# Patient Record
Sex: Female | Born: 1950
Health system: Southern US, Community
[De-identification: ages and names within clinical notes are randomized; demographics above are authoritative.]

## PROBLEM LIST (undated history)

## (undated) DIAGNOSIS — D573 Sickle-cell trait: Secondary | ICD-10-CM

## (undated) DIAGNOSIS — H47011 Ischemic optic neuropathy, right eye: Secondary | ICD-10-CM

## (undated) DIAGNOSIS — I4891 Unspecified atrial fibrillation: Secondary | ICD-10-CM

## (undated) DIAGNOSIS — I480 Paroxysmal atrial fibrillation: Secondary | ICD-10-CM

## (undated) DIAGNOSIS — H547 Unspecified visual loss: Secondary | ICD-10-CM

## (undated) DIAGNOSIS — B1920 Unspecified viral hepatitis C without hepatic coma: Secondary | ICD-10-CM

## (undated) DIAGNOSIS — T7840XA Allergy, unspecified, initial encounter: Secondary | ICD-10-CM

## (undated) DIAGNOSIS — M19049 Primary osteoarthritis, unspecified hand: Secondary | ICD-10-CM

## (undated) DIAGNOSIS — J302 Other seasonal allergic rhinitis: Secondary | ICD-10-CM

## (undated) HISTORY — DX: Paroxysmal atrial fibrillation: I48.0

## (undated) HISTORY — DX: Primary osteoarthritis, unspecified hand: M19.049

## (undated) HISTORY — DX: Unspecified viral hepatitis C without hepatic coma: B19.20

## (undated) HISTORY — DX: Ischemic optic neuropathy, right eye: H47.011

## (undated) HISTORY — DX: Other seasonal allergic rhinitis: J30.2

## (undated) HISTORY — DX: Allergy, unspecified, initial encounter: T78.40XA

## (undated) HISTORY — DX: Sickle-cell trait: D57.3

## (undated) HISTORY — DX: Unspecified atrial fibrillation: I48.91

## (undated) HISTORY — PX: TUMOR EXCISION: SHX421

## (undated) HISTORY — DX: Unspecified visual loss: H54.7

---

## 2003-10-07 HISTORY — PX: SALPINGECTOMY: SHX328

## 2009-07-23 ENCOUNTER — Encounter: Admission: RE | Admit: 2009-07-23 | Discharge: 2009-07-23 | Payer: Self-pay | Admitting: Infectious Diseases

## 2012-03-17 ENCOUNTER — Ambulatory Visit (INDEPENDENT_AMBULATORY_CARE_PROVIDER_SITE_OTHER): Payer: Self-pay | Admitting: Family Medicine

## 2012-03-17 ENCOUNTER — Encounter: Payer: Self-pay | Admitting: Family Medicine

## 2012-03-17 VITALS — BP 144/82 | HR 60 | Temp 97.8°F | Ht 62.0 in | Wt 155.8 lb

## 2012-03-17 DIAGNOSIS — Z0000 Encounter for general adult medical examination without abnormal findings: Secondary | ICD-10-CM

## 2012-03-17 DIAGNOSIS — J302 Other seasonal allergic rhinitis: Secondary | ICD-10-CM

## 2012-03-17 DIAGNOSIS — H47019 Ischemic optic neuropathy, unspecified eye: Secondary | ICD-10-CM

## 2012-03-17 DIAGNOSIS — Z01419 Encounter for gynecological examination (general) (routine) without abnormal findings: Secondary | ICD-10-CM | POA: Insufficient documentation

## 2012-03-17 DIAGNOSIS — H47011 Ischemic optic neuropathy, right eye: Secondary | ICD-10-CM | POA: Insufficient documentation

## 2012-03-17 DIAGNOSIS — J309 Allergic rhinitis, unspecified: Secondary | ICD-10-CM

## 2012-03-17 HISTORY — DX: Other seasonal allergic rhinitis: J30.2

## 2012-03-17 HISTORY — DX: Ischemic optic neuropathy, right eye: H47.011

## 2012-03-17 NOTE — Patient Instructions (Addendum)
Please go to [facility] to get qualified for financial assistance Once you have that, please call for an appt to follow up with [place] You will need to have your cholesterol checked and get a Pap smear  Please ask the front desk to change your doctor to Dr. Rodman Pickle

## 2012-03-17 NOTE — Assessment & Plan Note (Signed)
Exam today but deferred testing until qualified for orange card

## 2012-03-17 NOTE — Progress Notes (Signed)
  Subjective:    Patient ID: Melanie Riddle, female    DOB: [birth year], 61 y.o.   MRN: [id number]  HPI Patient here to establish care. Last Pap 2010 was normal DEXA scan 2010 was normal Mammogram 2010 was normal Does not remember her last tetanus shot  Patient with dry eyes and went to see optometrist recently. She was diagnosed with right optic nerve ischemia changes. She was told that she would need followup with a primary care doctor to evaluate her risk factors. She continues to have some pain on the right side that her optometrist told her was consistent with these changes. She denies any chest pain, shortness of breath. She had a visual field test which did show some changes.   Review of Systems No HA, CP, SOB, N/V/D    Objective:   Physical Exam Vital signs reviewed General appearance - alert, well appearing, and in no distress Heart - normal rate, regular rhythm, normal S1, S2, no murmurs, rubs, clicks or gallops Chest - clear to auscultation, no wheezes, rales or rhonchi, symmetric air entry, no tachypnea, retractions or cyanosis Abdomen - soft, nontender, nondistended, no masses or organomegaly Extremities - peripheral pulses normal, no pedal edema, no clubbing or cyanosis        Assessment & Plan:

## 2012-04-07 ENCOUNTER — Ambulatory Visit (INDEPENDENT_AMBULATORY_CARE_PROVIDER_SITE_OTHER): Payer: Self-pay | Admitting: Family Medicine

## 2012-04-07 ENCOUNTER — Encounter: Payer: Self-pay | Admitting: Family Medicine

## 2012-04-07 ENCOUNTER — Other Ambulatory Visit (HOSPITAL_COMMUNITY)
Admission: RE | Admit: 2012-04-07 | Discharge: 2012-04-07 | Disposition: A | Discharge status: Home / Self Care | Payer: Self-pay | Source: Ambulatory Visit | Attending: Family Medicine | Admitting: Family Medicine

## 2012-04-07 VITALS — BP 141/79 | HR 54 | Ht 62.0 in | Wt 155.0 lb

## 2012-04-07 DIAGNOSIS — H47011 Ischemic optic neuropathy, right eye: Secondary | ICD-10-CM

## 2012-04-07 DIAGNOSIS — H47019 Ischemic optic neuropathy, unspecified eye: Secondary | ICD-10-CM

## 2012-04-07 DIAGNOSIS — Z0000 Encounter for general adult medical examination without abnormal findings: Secondary | ICD-10-CM

## 2012-04-07 DIAGNOSIS — M19049 Primary osteoarthritis, unspecified hand: Secondary | ICD-10-CM

## 2012-04-07 DIAGNOSIS — Z1159 Encounter for screening for other viral diseases: Secondary | ICD-10-CM | POA: Insufficient documentation

## 2012-04-07 DIAGNOSIS — Z113 Encounter for screening for infections with a predominantly sexual mode of transmission: Secondary | ICD-10-CM | POA: Insufficient documentation

## 2012-04-07 DIAGNOSIS — Z01419 Encounter for gynecological examination (general) (routine) without abnormal findings: Secondary | ICD-10-CM | POA: Insufficient documentation

## 2012-04-07 HISTORY — DX: Primary osteoarthritis, unspecified hand: M19.049

## 2012-04-07 LAB — CBC
HCT: 38.1 % (ref 36.0–46.0)
Platelets: 231 10*3/uL (ref 150–400)
RBC: 4.34 MIL/uL (ref 3.87–5.11)
WBC: 6 10*3/uL (ref 4.0–10.5)

## 2012-04-07 NOTE — Assessment & Plan Note (Signed)
Patient stable. No changes in vision or range of motion of eye. Will follow up with optometry within one year. Will continue to follow with them. If she has any changes, she will return to office.

## 2012-04-07 NOTE — Assessment & Plan Note (Signed)
Patient overall very healthy. PAP collected today. Direct LDL and CBC collected. Will send results to patient. Return to office in 6 months, or sooner if she needs anything.

## 2012-04-07 NOTE — Progress Notes (Signed)
Subjective:     Patient ID: Melanie Riddle, female   DOB: March 31, 1951, 61 y.o.   MRN: [id number]  HPI Pt is a 61 yo F presenting for CPE. Patients concerns today are about her known ischemic optic neuropathy as well as intermittent right hand/wrist pain.  1. CPE- Last PAP >3 years ago. She has not had a mammogram recently either due to loss of insurance. She also would like her LDL checked today (ate this morning.) Patient is a vegetarian and does have to take iron occassionally but she is not sure if she has anemia. Overall, patient states she feels well. No headaches, no SOB, No CP, no abd pain, no vaginal bleeding, no vaginal d/c, no leg pain.   2. Ischemic optic neuropathy of right eye- Found on annual eye exam. Followed by Dr. Jorene Minors at [facility] center. She has not had any loss of vision. She does report some right jaw pain and difficulty chewing but no changes in vision.  3. Right wrist pain- She has noticed intermittent wrist pain. She does not like to take OTC medications. She has used natural rubs and ointments including menthol and Eucalyptus which helps some. She does not have any numbness. No difficulty with her daily activities. No nighttime awakenings with pain. She states it feels like arthritis.   History reviewed: Never smoker   Review of Systems Please see HPI above    Objective:   Physical Exam  Constitutional: She appears well-developed and well-nourished. No distress.  HENT:  Head: Normocephalic and atraumatic.  Right Ear: External ear normal.  Left Ear: External ear normal.  Mouth/Throat: Oropharynx is clear and moist.  Eyes: Conjunctivae, EOM and lids are normal. Pupils are equal, round, and reactive to light.  Fundoscopic exam:      The right eye shows no papilledema. The right eye shows red reflex.      The left eye shows no papilledema. The left eye shows red reflex. Neck: Normal range of motion.  Cardiovascular: Normal rate, regular rhythm and normal heart  sounds.   No murmur heard. Pulmonary/Chest: Effort normal and breath sounds normal.  Abdominal: Soft. There is no tenderness.  Genitourinary: Vagina normal and uterus normal. Cervix exhibits no motion tenderness and no discharge. Right adnexum displays no tenderness. Left adnexum displays no tenderness. No vaginal discharge found.  Musculoskeletal: Normal range of motion. She exhibits no edema.  Lymphadenopathy:    She has no cervical adenopathy.  Skin: Skin is warm and dry.   Assessment:     61 yo complete physical exam    Plan:

## 2012-04-07 NOTE — Assessment & Plan Note (Signed)
Pain in hand likely secondary to arthritis. No swelling, redness or effusions. Continue to use herbal rubs. Can also use heat, but should be avoided if using menthol. Patient does not like tylenol but can take if she would like. Will re-evaluate at next visit.

## 2012-04-07 NOTE — Patient Instructions (Signed)
It was so nice to meet you today!  I am glad everything is going well for you. I would try using heat on your wrist. You can also continue to use your rub, but not at the same time as the heat.  I will send you a letter with your labs. Please continue to be seen by the eye doctor once per year. Come back to see me in 6 months for a follow up.   Thank you. Call if you need anything!  Cambree Hendrix M. Bernis Stecher, M.D.

## 2012-04-08 LAB — LDL CHOLESTEROL, DIRECT: Direct LDL: 97 mg/dL

## 2012-04-09 ENCOUNTER — Encounter: Payer: Self-pay | Admitting: Family Medicine

## 2012-04-14 ENCOUNTER — Encounter: Payer: Self-pay | Admitting: Family Medicine

## 2012-04-23 ENCOUNTER — Ambulatory Visit
Admission: RE | Admit: 2012-04-23 | Discharge: 2012-04-23 | Disposition: A | Discharge status: Home / Self Care | Payer: Self-pay | Source: Ambulatory Visit | Attending: Family Medicine | Admitting: Family Medicine

## 2012-04-23 DIAGNOSIS — Z0000 Encounter for general adult medical examination without abnormal findings: Secondary | ICD-10-CM

## 2012-09-13 ENCOUNTER — Encounter: Payer: Self-pay | Admitting: Family Medicine

## 2012-09-13 ENCOUNTER — Ambulatory Visit (INDEPENDENT_AMBULATORY_CARE_PROVIDER_SITE_OTHER): Payer: No Typology Code available for payment source | Admitting: Family Medicine

## 2012-09-13 VITALS — BP 124/73 | HR 69 | Temp 98.5°F | Ht 62.0 in | Wt 152.0 lb

## 2012-09-13 DIAGNOSIS — K0889 Other specified disorders of teeth and supporting structures: Secondary | ICD-10-CM

## 2012-09-13 DIAGNOSIS — K089 Disorder of teeth and supporting structures, unspecified: Secondary | ICD-10-CM

## 2012-09-13 DIAGNOSIS — Z23 Encounter for immunization: Secondary | ICD-10-CM

## 2012-09-13 NOTE — Patient Instructions (Signed)
It was good to see you today!  I have placed referral to dentistry. We will call you with this appointment, but it may take many months to get in.  Please let me know if you need anything! Take care! Jesstin Studstill M. Dianely Krehbiel, M.D.

## 2012-09-13 NOTE — Assessment & Plan Note (Signed)
Patient with dental pain secondary to bridge work on right lower teeth. Also with caries of teeth. Requesting referral to dentistry. Patient does not have a dentist established in this area. Will refer for evaluation, and patient is aware there is a waiting list. At this time, no acute indication for antibiotics or pain medication. Will follow up as needed.

## 2012-09-13 NOTE — Progress Notes (Signed)
Patient ID: Raoul Pitch, female   DOB: 08/28/1951, 61 y.o.   MRN: [id number] [facility] Keirstan Iannello M. Osceola Depaz, MD Phone: [phone]   Subjective: HPI: Patient is a 61 y.o. female presenting to clinic today for dental pain. Concerns today include needs dental referral. Patient has history of bridge work in 2002 in [place], [place]. Last saw dentist 4 years ago; and when she lost her benefits she has not been able to see dentist. Now having pain around the bridge on right bottom gum. No fevers, no pain in jaw. Does notice bleeding with flossing. She would like referral to dentistry before the problem progresses.   History Reviewed: Former smoker. Health Maintenance: Needs flu shot today. Mammo in July 2013  ROS: Please see HPI above.  Objective: Office vital signs reviewed.  Physical Examination:  General: Awake, alert. NAD HEENT: Atraumatic, normocephalic. Posterior pharynx clear with no erythema. Bridge on right lower teeth without surrounding erythema and mild edema. Caries noted in multiple teeth. Good jaw movement without pain.  Neck: No masses palpated. Shotty LAD Pulm: CTAB, no wheezes Cardio: RRR, no murmurs appreciated Neuro: Grossly intact. CN 2-12 intact  Assessment: 61 yo F with dental pain  Plan: See Problem List and After Visit Summary

## 2013-02-08 ENCOUNTER — Ambulatory Visit (INDEPENDENT_AMBULATORY_CARE_PROVIDER_SITE_OTHER): Payer: No Typology Code available for payment source | Admitting: Family Medicine

## 2013-02-08 VITALS — BP 144/76 | HR 58 | Temp 97.6°F | Ht 62.0 in | Wt 152.0 lb

## 2013-02-08 DIAGNOSIS — R5383 Other fatigue: Secondary | ICD-10-CM

## 2013-02-08 DIAGNOSIS — R5381 Other malaise: Secondary | ICD-10-CM

## 2013-02-08 DIAGNOSIS — M2550 Pain in unspecified joint: Secondary | ICD-10-CM

## 2013-02-08 LAB — COMPREHENSIVE METABOLIC PANEL
ALT: 28 U/L (ref 0–35)
Calcium: 9.6 mg/dL (ref 8.4–10.5)
Chloride: 105 mEq/L (ref 96–112)
Glucose, Bld: 68 mg/dL — ABNORMAL LOW (ref 70–99)
Potassium: 4.3 mEq/L (ref 3.5–5.3)
Total Bilirubin: 1 mg/dL (ref 0.3–1.2)

## 2013-02-08 LAB — POCT SEDIMENTATION RATE: POCT SED RATE: 10 mm/hr (ref 0–22)

## 2013-02-08 LAB — CBC
Platelets: 214 10*3/uL (ref 150–400)
RBC: 4.45 MIL/uL (ref 3.87–5.11)

## 2013-02-08 LAB — TSH: TSH: 0.675 u[IU]/mL (ref 0.350–4.500)

## 2013-02-08 NOTE — Patient Instructions (Signed)
I am going to check a few labs today to see if we can find a reason for your symptoms. It is possible that these labs will be normal. I will call you with the results either way.  For now, continue what you are doing. I would also recommend taking Aleve twice daily to help with inflammation.  I will see you back in a few weeks for follow up.  Melbourne Jakubiak M. Celeste Tavenner, M.D.

## 2013-02-08 NOTE — Assessment & Plan Note (Signed)
DDx includes rheumatoid arthritis, post-viral reactive arthritis, DJD (less likely given the subacute time frame), or PMR. Will check sed rate today for inflammatory marker. Will also check CBC and CMet. If liver function elevated consider hepatitis as cause of reactive arthritis. F/u in 2-3 weeks.

## 2013-02-08 NOTE — Assessment & Plan Note (Signed)
See joint pain assessment. Unsure of exact etiology. It could be primary cause of fatigue (such as thyroid dysfunction) that is causing her to notice chronic joint pain, or it could also be post-viral illness. Will check CBC for anemia and TSH for thyroid. Will call pt with results.

## 2013-02-08 NOTE — Addendum Note (Signed)
Addended by: [place], Twylla Arceneaux on: 02/08/2013 12:25 PM   Modules accepted: Orders

## 2013-02-08 NOTE — Progress Notes (Signed)
Patient ID: Melanie Riddle, female   DOB: 11/28/1950, 62 y.o.   MRN: [id number]  [facility] Miamor Ayler M. Sharalyn Lomba, MD Phone: [phone]   Subjective: HPI: Patient is a 62 y.o. female presenting to clinic today for same day appointment.  Fatigue- Increased fatigue in the last 3 weeks. Fatigue is about the same but she is starting to notice it more. Able to function at same level but increased desire to rest. Had a bad cold first week in April (took week off), but states it did not feel like the flu. No known rashes. No bug bites or tick bites. Never had an experience like this before. No naps during the day but has noticed sleep pattern changes and can't sleep through the night.   Joint pain- New achiness in the joints, especially in ankles and feet. She states the bottom of feet are very tender. Some relief with massage but comes right back. Corn on right toe aches, especially at night. Wrists also hurt and middle/lower back pain. No medications, prefers natural treatment. Denies weakness in legs, no numbness or tingling.   Reports SOB with walking. Denies HA, changes in vision, palpitations, N/V/D, dysuria, loss of bowel/bladder, numbness in legs.  History Reviewed: Non-smoker. Health Maintenance: UTD except colonoscopy.   ROS: Please see HPI above.  Objective: Office vital signs reviewed. BP 144/76  Pulse 58  Temp(Src) 97.6 F (36.4 C) (Oral)  Ht 5\' 2"  (1.575 m)  Wt 152 lb (68.947 kg)  BMI 27.79 kg/m2  Physical Examination:  General: Awake, alert. NAD. Pleasant HEENT: Atraumatic, normocephalic. MMM. Pupils equal and reactive Neck: No masses palpated. No LAD Pulm: CTAB, no wheezes. Good effort Cardio: Bradycardic, regular rhythm, no murmurs appreciated Abdomen:+BS, soft, nontender, nondistended Extremities: No edema. 4/5 strength upper extremities, and equal bilaterally. Mild weakness of right hip flexor compared to left but able to stand without assistance.  Otherwise, strength normal Neuro: Grossly intact without focal findings  Assessment: 62 y.o. female with fatigue and joint pain  Plan: See Problem List and After Visit Summary

## 2013-02-09 ENCOUNTER — Encounter: Payer: Self-pay | Admitting: Family Medicine

## 2013-02-09 ENCOUNTER — Telehealth: Payer: Self-pay | Admitting: Family Medicine

## 2013-02-09 NOTE — Telephone Encounter (Signed)
Spoke with patient and reassured her that all of her labs looked normal. Will send letter with her lab results for her reference but no obvious cause of her symptoms noted on labs.  Harris Kistler M. Shenae Bonanno, M.D. 02/09/2013 4:01 PM

## 2013-03-31 ENCOUNTER — Ambulatory Visit: Payer: Self-pay | Admitting: Family Medicine

## 2013-07-13 ENCOUNTER — Ambulatory Visit (INDEPENDENT_AMBULATORY_CARE_PROVIDER_SITE_OTHER): Payer: No Typology Code available for payment source | Admitting: *Deleted

## 2013-07-13 DIAGNOSIS — Z23 Encounter for immunization: Secondary | ICD-10-CM

## 2014-03-08 ENCOUNTER — Encounter: Payer: Self-pay | Admitting: Family Medicine

## 2014-03-08 ENCOUNTER — Ambulatory Visit (INDEPENDENT_AMBULATORY_CARE_PROVIDER_SITE_OTHER): Payer: Medicare Other | Admitting: Family Medicine

## 2014-03-08 VITALS — BP 110/60 | HR 75 | Temp 98.2°F | Wt 157.0 lb

## 2014-03-08 DIAGNOSIS — Z789 Other specified health status: Secondary | ICD-10-CM

## 2014-03-08 DIAGNOSIS — Z0000 Encounter for general adult medical examination without abnormal findings: Secondary | ICD-10-CM | POA: Diagnosis not present

## 2014-03-08 DIAGNOSIS — R5381 Other malaise: Secondary | ICD-10-CM | POA: Diagnosis not present

## 2014-03-08 DIAGNOSIS — R5383 Other fatigue: Secondary | ICD-10-CM | POA: Diagnosis not present

## 2014-03-08 DIAGNOSIS — Z136 Encounter for screening for cardiovascular disorders: Secondary | ICD-10-CM | POA: Diagnosis not present

## 2014-03-08 DIAGNOSIS — Z01419 Encounter for gynecological examination (general) (routine) without abnormal findings: Secondary | ICD-10-CM

## 2014-03-08 LAB — BASIC METABOLIC PANEL
BUN: 11 mg/dL (ref 6–23)
CHLORIDE: 106 meq/L (ref 96–112)
CO2: 28 meq/L (ref 19–32)
Calcium: 9.4 mg/dL (ref 8.4–10.5)
Creat: 0.72 mg/dL (ref 0.50–1.10)
Glucose, Bld: 71 mg/dL (ref 70–99)
Potassium: 4.2 mEq/L (ref 3.5–5.3)
SODIUM: 141 meq/L (ref 135–145)

## 2014-03-08 LAB — TSH: TSH: 0.78 u[IU]/mL (ref 0.350–4.500)

## 2014-03-08 LAB — CBC
HCT: 39.4 % (ref 36.0–46.0)
HEMOGLOBIN: 13.5 g/dL (ref 12.0–15.0)
MCH: 29.7 pg (ref 26.0–34.0)
MCHC: 34.3 g/dL (ref 30.0–36.0)
MCV: 86.6 fL (ref 78.0–100.0)
Platelets: 241 10*3/uL (ref 150–400)
RBC: 4.55 MIL/uL (ref 3.87–5.11)
RDW: 13.6 % (ref 11.5–15.5)
WBC: 6.3 10*3/uL (ref 4.0–10.5)

## 2014-03-08 MED ORDER — ZOSTER VACCINE LIVE 19400 UNT/0.65ML ~~LOC~~ SOLR: 0.6500 mL | Freq: Once | SUBCUTANEOUS | Status: DC

## 2014-03-08 MED ORDER — TETANUS-DIPHTH-ACELL PERTUSSIS 5-2.5-18.5 LF-MCG/0.5 IM SUSP: 0.5000 mL | Freq: Once | INTRAMUSCULAR | Status: DC

## 2014-03-08 NOTE — Assessment & Plan Note (Signed)
Improving with multivitamin and bee pollen  - TSH, CBC for anemia and Bmet for electrolyte abnormalities - F/u prn

## 2014-03-08 NOTE — Progress Notes (Signed)
Patient ID: Melanie Riddle, female   DOB: November 25, 1950, 63 y.o.   MRN: [id number]    Subjective: HPI: Patient is a 63 y.o. female presenting to clinic today for CPE. Concerns today include: Fatigue  1. Health maintenance:  - UTD on pap - Needs mammo - Never had colonoscopy, hesitant to get it done - Unsure last Tdap - Never had zostavax - UTD on flu vaccine  2. Fatigue: Continues to have some fatigue. She is using Multivitamin and bee pollen which helps her some. Last TSH normal. Able to do all activities. She is a vegetarian.    History Reviewed: Former smoker.   ROS: Please see HPI above.  Objective: Office vital signs reviewed. BP 110/60  Pulse 75  Temp(Src) 98.2 F (36.8 C) (Oral)  Wt 157 lb (71.215 kg)  Physical Examination:  General: Awake, alert. NAD HEENT: Atraumatic, normocephalic Neck: No masses palpated. No LAD Pulm: CTAB, no wheezes Cardio: RRR, no murmurs appreciated Abdomen:+BS, soft, nontender, nondistended Extremities: No edema Neuro: Grossly intact  Assessment: 63 y.o. female CPE  Plan: See Problem List and After Visit Summary

## 2014-03-08 NOTE — Assessment & Plan Note (Signed)
Health maintenance updated-  Given Rx for Tdap and Zostavax Will get Prevnar this fall at time of flu shot Will get mammo Unable to order Lipids due to insurance Does not want colonoscopy but I will look into other screening methods

## 2014-03-08 NOTE — Patient Instructions (Signed)
It was good to see you today.  - Please schedule your mammogram - I will do research for the CT scan for colon cancer screening and let you know - I have printed the shingles vaccine if you chose to get that - When you get your flu shot, please ask for the Prevnar 13 pneumonia vaccine as well.  I will call you if any of your labs are abnormal. Otherwise, I will send you a letter.  Blaklee Shores M. Tomie Elko, M.D.

## 2014-03-09 ENCOUNTER — Encounter: Payer: Self-pay | Admitting: Family Medicine

## 2014-06-28 DIAGNOSIS — I480 Paroxysmal atrial fibrillation: Secondary | ICD-10-CM

## 2014-06-28 HISTORY — DX: Paroxysmal atrial fibrillation: I48.0

## 2014-07-12 ENCOUNTER — Ambulatory Visit (INDEPENDENT_AMBULATORY_CARE_PROVIDER_SITE_OTHER): Payer: Medicare Other | Admitting: *Deleted

## 2014-07-12 DIAGNOSIS — Z23 Encounter for immunization: Secondary | ICD-10-CM | POA: Diagnosis not present

## 2014-08-01 ENCOUNTER — Telehealth: Payer: Self-pay | Admitting: Family Medicine

## 2014-08-01 ENCOUNTER — Other Ambulatory Visit: Payer: Self-pay

## 2014-08-01 DIAGNOSIS — Z1211 Encounter for screening for malignant neoplasm of colon: Secondary | ICD-10-CM

## 2014-08-01 DIAGNOSIS — Z1231 Encounter for screening mammogram for malignant neoplasm of breast: Secondary | ICD-10-CM

## 2014-08-01 NOTE — Telephone Encounter (Signed)
Pt called and wanted to know if a referral was going to be in place for her to have a colonoscopy. She said the Dr. Sheral Apley was looking into this before she left and wanted to know what the next step ins going to be. Blima Rich

## 2014-08-01 NOTE — Telephone Encounter (Signed)
Patient does not need a referral for a colonoscopy. She just needs to be given the contact information for colonoscopies/GI.

## 2014-08-02 NOTE — Telephone Encounter (Signed)
Regular Medicare does need a referral for GI. Melanie Riddle, Melanie Riddle

## 2014-08-02 NOTE — Telephone Encounter (Signed)
Order placed

## 2014-08-03 ENCOUNTER — Ambulatory Visit (INDEPENDENT_AMBULATORY_CARE_PROVIDER_SITE_OTHER): Payer: Medicare Other | Admitting: *Deleted

## 2014-08-03 DIAGNOSIS — Z23 Encounter for immunization: Secondary | ICD-10-CM | POA: Diagnosis not present

## 2014-08-04 ENCOUNTER — Encounter: Payer: Self-pay | Admitting: Internal Medicine

## 2014-08-11 ENCOUNTER — Ambulatory Visit
Admission: RE | Admit: 2014-08-11 | Discharge: 2014-08-11 | Disposition: A | Discharge status: Home / Self Care | Payer: Medicare Other | Source: Ambulatory Visit

## 2014-08-11 DIAGNOSIS — Z1231 Encounter for screening mammogram for malignant neoplasm of breast: Secondary | ICD-10-CM

## 2014-08-15 ENCOUNTER — Other Ambulatory Visit: Payer: Self-pay | Admitting: Family Medicine

## 2014-08-15 DIAGNOSIS — R928 Other abnormal and inconclusive findings on diagnostic imaging of breast: Secondary | ICD-10-CM

## 2014-09-04 ENCOUNTER — Ambulatory Visit
Admission: RE | Admit: 2014-09-04 | Discharge: 2014-09-04 | Disposition: A | Discharge status: Home / Self Care | Payer: Medicare Other | Source: Ambulatory Visit | Attending: *Deleted | Admitting: *Deleted

## 2014-09-04 DIAGNOSIS — R928 Other abnormal and inconclusive findings on diagnostic imaging of breast: Secondary | ICD-10-CM

## 2014-09-04 DIAGNOSIS — N63 Unspecified lump in breast: Secondary | ICD-10-CM | POA: Diagnosis not present

## 2014-09-13 ENCOUNTER — Ambulatory Visit (AMBULATORY_SURGERY_CENTER): Payer: Self-pay | Admitting: *Deleted

## 2014-09-13 VITALS — Ht 62.0 in | Wt 163.0 lb

## 2014-09-13 DIAGNOSIS — Z1211 Encounter for screening for malignant neoplasm of colon: Secondary | ICD-10-CM

## 2014-09-13 NOTE — Progress Notes (Signed)
Patient denies any allergies to eggs or soy. Patient denies any problems with anesthesia/sedation. Patient denies any oxygen use at home and does not take any diet/weight loss medications. EMMI education assisgned to patient on colonoscopy, this was explained and instructions given to patient. Patient states she unable to afford the moviprep if not covered by insurance. I explained to patient that it could cost about $80. Miralax/dulcolax OTC  Prep instructions given to patient.

## 2014-09-27 ENCOUNTER — Ambulatory Visit (INDEPENDENT_AMBULATORY_CARE_PROVIDER_SITE_OTHER): Payer: Medicare Other | Admitting: Internal Medicine

## 2014-09-27 ENCOUNTER — Encounter: Payer: Self-pay | Admitting: Internal Medicine

## 2014-09-27 ENCOUNTER — Telehealth: Payer: Self-pay

## 2014-09-27 ENCOUNTER — Ambulatory Visit (AMBULATORY_SURGERY_CENTER): Payer: Medicare Other | Admitting: Internal Medicine

## 2014-09-27 ENCOUNTER — Encounter: Payer: Self-pay | Admitting: *Deleted

## 2014-09-27 VITALS — BP 134/76 | HR 90 | Ht 62.0 in | Wt 160.0 lb

## 2014-09-27 VITALS — BP 131/86 | HR 99 | Temp 97.7°F | Resp 15 | Ht 62.0 in | Wt 163.0 lb

## 2014-09-27 DIAGNOSIS — I480 Paroxysmal atrial fibrillation: Secondary | ICD-10-CM | POA: Diagnosis not present

## 2014-09-27 DIAGNOSIS — D123 Benign neoplasm of transverse colon: Secondary | ICD-10-CM

## 2014-09-27 DIAGNOSIS — I4891 Unspecified atrial fibrillation: Secondary | ICD-10-CM | POA: Diagnosis not present

## 2014-09-27 DIAGNOSIS — Z1211 Encounter for screening for malignant neoplasm of colon: Secondary | ICD-10-CM | POA: Diagnosis not present

## 2014-09-27 DIAGNOSIS — D125 Benign neoplasm of sigmoid colon: Secondary | ICD-10-CM | POA: Diagnosis not present

## 2014-09-27 MED ORDER — FLECAINIDE ACETATE 100 MG PO TABS: ORAL_TABLET | ORAL | Status: DC

## 2014-09-27 MED ORDER — SODIUM CHLORIDE 0.9 % IV SOLN: 500.0000 mL | INTRAVENOUS | Status: DC

## 2014-09-27 MED ORDER — RIVAROXABAN 20 MG PO TABS
ORAL_TABLET | ORAL | Status: DC
Start: 2014-09-27 — End: 2015-08-09

## 2014-09-27 MED ORDER — DILTIAZEM HCL 30 MG PO TABS: ORAL_TABLET | ORAL | Status: DC

## 2014-09-27 NOTE — Op Note (Signed)
[place]  [address] [place], [address]   COLONOSCOPY PROCEDURE REPORT  PATIENT: Melanie Riddle, Melanie Riddle  MR#: [id number] BIRTHDATE: Jun 22, 1951 , 30  yrs. old GENDER: female ENDOSCOPIST: Jerene Bears, MD REFERRED BY: Tommi Rumps, MD PROCEDURE DATE:  09/27/2014 PROCEDURE:   Colonoscopy with snare polypectomy First Screening Colonoscopy - Avg.  risk and is 50 yrs.  old or older Yes.  Prior Negative Screening - Now for repeat screening. N/A  History of Adenoma - Now for follow-up colonoscopy & has been > or = to 3 yrs.  N/A  Polyps Removed Today? Yes. ASA CLASS:   Class II INDICATIONS:average risk for colon cancer and first colonoscopy. MEDICATIONS: Monitored anesthesia care and Propofol 300 mg IV  DESCRIPTION OF PROCEDURE:   After the risks benefits and alternatives of the procedure were thoroughly explained, informed consent was obtained.  The digital rectal exam revealed no rectal mass.   The LB [id number]  endoscope was introduced through the anus and advanced to the terminal ileum which was intubated for a short distance. No adverse events experienced.   The quality of the prep was good, using MoviPrep  The instrument was then slowly withdrawn as the colon was fully examined.   COLON FINDINGS: The examined terminal ileum appeared to be normal. A flat polyp measuring 8 mm in size with a mucous cap was found in the transverse colon.  Polypectomies were performed with a cold snare.  The resection was complete, the polyp tissue was completely retrieved and sent to histology.   Two sessile polyps measuring 5 mm in size were found in the sigmoid colon.  Polypectomies were performed with a cold snare.  The resection was complete, the polyp tissue was completely retrieved and sent to histology.  Retroflexed views revealed no abnormalities. The time to cecum=5 minutes 25 seconds.  Withdrawal time=19 minutes 30 seconds.  The scope was withdrawn and the procedure  completed.  COMPLICATIONS: There were no immediate complications.  ENDOSCOPIC IMPRESSION: 1.   The examined terminal ileum appeared to be normal 2.   Flat polyp was found in the transverse colon; polypectomies were performed with a cold snare 3.   Two sessile polyps were found in the sigmoid colon; polypectomies were performed with a cold snare  RECOMMENDATIONS: 1.  Await pathology results 2.  Timing of repeat colonoscopy will be determined by pathology findings. 3.  You will receive a letter within 1-2 weeks with the results of your biopsy as well as final recommendations.  Please call my office if you have not received a letter after 3 weeks. 4.  Atrial fibrillation observed during telemetry monitoring, cardiology referral  eSigned:  Jerene Bears, MD 09/27/2014 12:38 PM   cc: the patient, Tommi Rumps, MD   PATIENT NAME:  Melanie Riddle, Melanie Riddle MR#: [id number]

## 2014-09-27 NOTE — Progress Notes (Signed)
Called to room to assist during endoscopic procedure.  Patient ID and intended procedure confirmed with present staff. Received instructions for my participation in the procedure from the performing physician.  

## 2014-09-27 NOTE — Patient Instructions (Signed)
Your physician recommends that you schedule a follow-up appointment in: 4 weeks with Roderic Palau, NP  Your physician has requested that you have an echocardiogram. Echocardiography is a painless test that uses sound waves to create images of your heart. It provides your doctor with information about the size and shape of your heart and how well your heart's chambers and valves are working. This procedure takes approximately one hour. There are no restrictions for this procedure.  Your physician has recommended you make the following change in your medication:  1) If still in A. Fib on 12/24 take Flecainide 100mg  - take 3 tabs once 2) On12/25 if still in A. Fib start Xarelto 20 mg daily for 7 days 3) Take Cardizem 30 mg 1-2 tabs as needed every 6 hours for fast heart rate.  On Monday, 12/28 call Roderic Palau, NP with update. If still in A. Fib will setup for Cardioversion.

## 2014-09-27 NOTE — Progress Notes (Signed)
Pt has been symptom free while in the recovery room.  She dressed herself.  Dr. Hilarie Fredrickson in to the recovery room to check on the pt.  He view both EKG's and pt ready to be discharged.  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 Pt is aware she is not to drive for 24 hour.  No complaints noted on discharge.  Pt has copies of all discharge instruction and also copies of both EKG's done in the recovery room.  Pt has no questions or discharge. maw

## 2014-09-27 NOTE — Progress Notes (Signed)
Pt up and sat on the side of the bed few minutes.  Pt instructed to let me know if she was having and sx, anything different than the way she normally feels.  Pt state, "I feels ok".  Pt assited to the restroom. maw

## 2014-09-27 NOTE — Progress Notes (Signed)
Riki Sheer, LPN and Cheree Ditto, RN in to get EKG for pt as ordered by Dr. Zenovia Jarred.  Also pt has a appointment at [facility] [name], [address], with Butch Penny, NP at 3:00 pm today.   If pt becomes symptomatic or has dizziness when she gets up to dress, then she will go EMS to [facility] ED.  If not sx then pt can have her friend to drive her to appointment at [facility].

## 2014-09-27 NOTE — Progress Notes (Signed)
A/ox3 pleased with MAC, report to Tracy RN 

## 2014-09-27 NOTE — Progress Notes (Signed)
Patient used bedpan while supine. Patient in a fib upon admission to recovery. EKG ordered.

## 2014-09-27 NOTE — Telephone Encounter (Signed)
Pt here for colonoscopy, pt developed A. Fib at end of procedure. Per Dr. Hilarie Fredrickson pt needs cardiology referral. Pt scheduled to see NP Butch Penny at [facility] today at [place] RN to notify pt of appt.

## 2014-09-27 NOTE — Patient Instructions (Signed)
YOU HAD AN ENDOSCOPIC PROCEDURE TODAY AT THE [facility]: Refer to the procedure report that was given to you for any specific questions about what was found during the examination.  If the procedure report does not answer your questions, please call your gastroenterologist to clarify.  If you requested that your care partner not be given the details of your procedure findings, then the procedure report has been included in a sealed envelope for you to review at your convenience later.  YOU SHOULD EXPECT: Some feelings of bloating in the abdomen. Passage of more gas than usual.  Walking can help get rid of the air that was put into your GI tract during the procedure and reduce the bloating. If you had a lower endoscopy (such as a colonoscopy or flexible sigmoidoscopy) you may notice spotting of blood in your stool or on the toilet paper. If you underwent a bowel prep for your procedure, then you may not have a normal bowel movement for a few days.  DIET: Your first meal following the procedure should be a light meal and then it is ok to progress to your normal diet.  A half-sandwich or bowl of soup is an example of a good first meal.  Heavy or fried foods are harder to digest and may make you feel nauseous or bloated.  Likewise meals heavy in dairy and vegetables can cause extra gas to form and this can also increase the bloating.  Drink plenty of fluids but you should avoid alcoholic beverages for 24 hours.  ACTIVITY: Your care partner should take you home directly after the procedure.  You should plan to take it easy, moving slowly for the rest of the day.  You can resume normal activity the day after the procedure however you should NOT DRIVE or use heavy machinery for 24 hours (because of the sedation medicines used during the test).    SYMPTOMS TO REPORT IMMEDIATELY: A gastroenterologist can be reached at any hour.  During normal business hours, 8:30 AM to 5:00 PM Monday through Friday,  call [phone].  After hours and on weekends, please call the GI answering service at [phone] who will take a message and have the physician on call contact you.   Following lower endoscopy (colonoscopy or flexible sigmoidoscopy):  Excessive amounts of blood in the stool  Significant tenderness or worsening of abdominal pains  Swelling of the abdomen that is new, acute  Fever of 100F or higher FOLLOW UP: If any biopsies were taken you will be contacted by phone or by letter within the next 1-3 weeks.  Call your gastroenterologist if you have not heard about the biopsies in 3 weeks.  Our staff will call the home number listed on your records the next business day following your procedure to check on you and address any questions or concerns that you may have at that time regarding the information given to you following your procedure. This is a courtesy call and so if there is no answer at the home number and we have not heard from you through the emergency physician on call, we will assume that you have returned to your regular daily activities without incident.  SIGNATURES/CONFIDENTIALITY: You and/or your care partner have signed paperwork which will be entered into your electronic medical record.  These signatures attest to the fact that that the information above on your After Visit Summary has been reviewed and is understood.  Full responsibility of the confidentiality of this discharge   information lies with you and/or your care-partner.   Pt has appointment at [facility] today at 3:00 pm with Butch Penny, FNP for atrial fib.  Pt understands and agrees to go for appointment.   Handout was given to you on polyps. You may resume your current medications today. Await biopsy results. Please call if any questions or concerns.

## 2014-09-28 ENCOUNTER — Encounter: Payer: Self-pay | Admitting: Internal Medicine

## 2014-09-28 DIAGNOSIS — I4891 Unspecified atrial fibrillation: Secondary | ICD-10-CM

## 2014-09-28 HISTORY — DX: Unspecified atrial fibrillation: I48.91

## 2014-09-28 NOTE — Progress Notes (Signed)
Primary Care Physician: Tommi Rumps, MD Referring Physician:  Dr Kermit Balo is a 63 y.o. female without significant cardiac history who presents urgently from [facility] with afib.  The patient was in her usual state of health eaerlier today.  She presented for colonoscopy after an overnight prep.  During the procedure she was noted to have bradycardia.  She then went into sustained afib with RVR.  She reprots symptoms of palpitations and "nervousness".  She is clear that she has never had afib previously.  She is referred for further management.  Today, she denies symptoms of chest pain, shortness of breath, orthopnea, PND, lower extremity edema, dizziness, presyncope, syncope, or neurologic sequela. The patient is tolerating medications without difficulties and is otherwise without complaint today.   Past Medical History  Diagnosis Date  . Allergy   . Sickle cell trait   . Paroxysmal atrial fibrillation 06/28/14    single episodes occured during recovery post colonoscopy   Past Surgical History  Procedure Laterality Date  . Cesarean section    . Tumor excision Right     benign right thigh  . Salpingectomy Right     Current Outpatient Prescriptions  Medication Sig Dispense Refill  . Ascorbic Acid (VITAMIN C) 1000 MG tablet Take 1,000 mg by mouth daily.    . Lactobacillus (ACIDOPHILUS PO) Take 1 tablet by mouth daily.    . Multiple Minerals-Vitamins (CALCIUM-MAGNESIUM-ZINC-D3 PO) Take 1 tablet by mouth daily.    . Omega-3 Fatty Acids (FISH OIL) 1200 MG CAPS Take 1 capsule by mouth daily.    Marland Kitchen diltiazem (CARDIZEM) 30 MG tablet Take 1 to 2 tabs every 6 hours as needed. 16 tablet 0  . flecainide (TAMBOCOR) 100 MG tablet If still in A. Fib take 3 tabs by mouth once. 3 tablet 0  . rivaroxaban (XARELTO) 20 MG TABS tablet Only take 1 pill per day for 1 week. 30 tablet 0  . Tdap (BOOSTRIX) 5-2.5-18.5 LF-MCG/0.5 injection Inject 0.5 mLs into the muscle once. (Patient  not taking: Reported on 09/27/2014) 0.5 mL 0  . zoster vaccine live, PF, (ZOSTAVAX) 78295 UNT/0.65ML injection Inject 19,400 Units into the skin once. (Patient not taking: Reported on 09/27/2014) 1 each 0   No current facility-administered medications for this visit.    Allergies  Allergen Reactions  . Penicillins Swelling    History   Social History  . Marital Status: Single    Spouse Name: N/A    Number of Children: N/A  . Years of Education: N/A   Occupational History  . Not on file.   Social History Main Topics  . Smoking status: Former Research scientist (life sciences)  . Smokeless tobacco: Never Used  . Alcohol Use: Yes     Comment: wine 3-7 per week  . Drug Use: No  . Sexual Activity: Yes   Other Topics Concern  . Not on file   Social History Narrative    Family History  Problem Relation Age of Onset  . Kidney disease Father   . Sickle cell anemia Brother   . Hyperlipidemia Brother   . Stroke Brother   . Cancer Maternal Aunt     lung cancer  . Colon cancer Neg Hx     ROS- All systems are reviewed and negative except as per the HPI above  Physical Exam: Filed Vitals:   09/27/14 1505  BP: 134/76  Pulse: 90  Height: 5\' 2"  (1.575 m)  Weight: 160 lb (72.576 kg)  GEN- The patient is well appearing, alert and oriented x 3 today.   Head- normocephalic, atraumatic Eyes-  Sclera clear, conjunctiva pink Ears- hearing intact Oropharynx- clear Neck- supple, no JVP Lymph- no cervical lymphadenopathy Lungs- Clear to ausculation bilaterally, normal work of breathing Heart- Regular rate and rhythm, no murmurs, rubs or gallops, PMI not laterally displaced GI- soft, NT, ND, + BS Extremities- no clubbing, cyanosis, or edema MS- no significant deformity or atrophy Skin- no rash or lesion Psych- euthymic mood, full affect Neuro- strength and sensation are intact  EKG today reveals afib with V rate 127 bpm, nonspecific ST/T changes  Assessment and Plan:  1. New onset afib The  patient presents for urgent evaluation of new onset afib.  This occurred acutely in the setting of colonoscopy and likely represents vagally mediated afib. At this point, I will start cardizem which she take overnight for rate control.  IF she does not spontaneously convert to sinus rhythm overnight, she is given flecainide 300mg  po that she can take x 1 as a "pill in pocket" approach for her afib.  Further, if she does not convert to sinus by Friday, she will start xarelto 20mg  daily at that time with plans for cardioversion electively in the next few weeks.  Hopefully this will not be required. She will be contacted by Roderic Palau NP early next week to formalize the plan.  IF she has converted to sinus at that time, she will return to see [place] in 4 weeks. She will need an echo to evaluate for structural heart disease as a cause for her afib.  Recent TFTs were normal. Her chads2vasc score is 2.  As this is a single episode of provoked afib, I do not feel that she will require long term anticoagulation unless afib returns. She is at high risk for decompensation and hospitalization.  A high level of decision making was required today.  She will be followed closely with Roderic Palau NP in the AF clinic. I have spoken with Dr Hilarie Fredrickson regarding this plan.

## 2014-10-02 ENCOUNTER — Telehealth: Payer: Self-pay | Admitting: *Deleted

## 2014-10-02 ENCOUNTER — Telehealth (HOSPITAL_COMMUNITY): Payer: Self-pay | Admitting: Nurse Practitioner

## 2014-10-02 NOTE — Telephone Encounter (Signed)
  Follow up Call-  Call back number 09/27/2014  Post procedure Call Back phone  # [phone]  Permission to leave phone message Yes     Patient questions:  Do you have a fever, pain , or abdominal swelling? No. Pain Score  0 *  Have you tolerated food without any problems? Yes.    Have you been able to return to your normal activities? Yes.    Do you have any questions about your discharge instructions: Diet   No. Medications  No. Follow up visit  No.  Do you have questions or concerns about your Care? No.  Actions: * If pain score is 4 or above: No action needed, pain <4.

## 2014-10-02 NOTE — Telephone Encounter (Signed)
Pt is feeling well. Went back into SR during the night of 12-24. Did not take any medicine to obtain SR. HAs an echo on Tuesday and a f/u with me within the month. Contact earlier if return of afib.

## 2014-10-03 ENCOUNTER — Ambulatory Visit (HOSPITAL_COMMUNITY): Payer: Medicare Other | Attending: Cardiovascular Disease | Admitting: Cardiology

## 2014-10-03 DIAGNOSIS — Z87891 Personal history of nicotine dependence: Secondary | ICD-10-CM | POA: Diagnosis not present

## 2014-10-03 DIAGNOSIS — I4891 Unspecified atrial fibrillation: Secondary | ICD-10-CM | POA: Diagnosis not present

## 2014-10-03 NOTE — Progress Notes (Signed)
Echo performed. 

## 2014-10-09 ENCOUNTER — Encounter: Payer: Self-pay | Admitting: Internal Medicine

## 2014-10-25 ENCOUNTER — Ambulatory Visit: Payer: Medicare Other | Admitting: Nurse Practitioner

## 2015-08-09 ENCOUNTER — Encounter: Payer: Self-pay | Admitting: Family Medicine

## 2015-08-09 ENCOUNTER — Ambulatory Visit (INDEPENDENT_AMBULATORY_CARE_PROVIDER_SITE_OTHER): Payer: Medicare Other | Admitting: Family Medicine

## 2015-08-09 ENCOUNTER — Other Ambulatory Visit: Payer: Self-pay | Admitting: Family Medicine

## 2015-08-09 VITALS — BP 133/69 | HR 49 | Temp 98.8°F | Ht 62.0 in | Wt 157.2 lb

## 2015-08-09 DIAGNOSIS — Z114 Encounter for screening for human immunodeficiency virus [HIV]: Secondary | ICD-10-CM

## 2015-08-09 DIAGNOSIS — Z7289 Other problems related to lifestyle: Secondary | ICD-10-CM | POA: Diagnosis not present

## 2015-08-09 DIAGNOSIS — Z118 Encounter for screening for other infectious and parasitic diseases: Secondary | ICD-10-CM

## 2015-08-09 DIAGNOSIS — B1920 Unspecified viral hepatitis C without hepatic coma: Secondary | ICD-10-CM

## 2015-08-09 DIAGNOSIS — Z780 Asymptomatic menopausal state: Secondary | ICD-10-CM

## 2015-08-09 DIAGNOSIS — Z23 Encounter for immunization: Secondary | ICD-10-CM

## 2015-08-09 DIAGNOSIS — Z0000 Encounter for general adult medical examination without abnormal findings: Secondary | ICD-10-CM

## 2015-08-09 DIAGNOSIS — Z1159 Encounter for screening for other viral diseases: Secondary | ICD-10-CM | POA: Diagnosis not present

## 2015-08-09 DIAGNOSIS — Z609 Problem related to social environment, unspecified: Secondary | ICD-10-CM

## 2015-08-09 DIAGNOSIS — H547 Unspecified visual loss: Secondary | ICD-10-CM

## 2015-08-09 MED ORDER — ZOSTER VACCINE LIVE 19400 UNT/0.65ML ~~LOC~~ SOLR: 0.6500 mL | Freq: Once | SUBCUTANEOUS | Status: DC

## 2015-08-09 NOTE — Patient Instructions (Addendum)
Melanie Riddle , Thank you for taking time to come for your Medicare Wellness Visit. I appreciate your ongoing commitment to your health goals. Please review the following plan we discussed and let me know if I can assist you in the future.   These are the goals we discussed: Goals    Portion Control     Eye Exam by opthalmologist or optometrist recommended because of your decrease in visual acuity   This is a list of the screening recommended for you and due dates:  Health Maintenance  Topic Date Due  .  Hepatitis C: One time screening is recommended by Center for Disease Control  (CDC) for  adults born from [birth year] through 1967.   1951/04/23  . Tetanus Vaccine  03/14/1968  . Shingles Vaccine  03/14/2009  . DEXA scan (bone density measurement)  03/14/2014  . Flu Shot  05/07/2015  . Pneumonia vaccines (2 of 2 - PPSV23) 08/04/2015  . Mammogram  09/04/2016  . Colon Cancer Screening  09/27/2024   Recommend seeing optometrist or ophthalmologist for Glaucoma screening which is covered by your Medicare Part B 100%.   .Shingles Vaccine (Zostavax) What is Shingles (also called Herpes Zoster)? A painful skin rash caused by the same virus that caused your chicken pox when you were a child.  The pain can be severe for some people and can last for 2 to 4 weeks and sometimes much longer.  Why does my doctor want me to get the shingles vaccination? It can prevent shingles in about half of people given the vaccine and in those patients who still get the shingles, the pain is less strong and will not last a long time.   What are the risks form the shingles vaccine? Severe reactions are rare.  Most common reactions are redness, soreness, swelling or itching at the injection site.   How do I get the Shingles vaccine (Zostavax) 1. Get a prescription for Zostavax from your doctor.  2. While all Medicare Part C & D plans* must cover all commercially available vaccines to prevent illness, some Part D  plans may have special rules such as requiring prior authorization before they will cover the cost of Zostavax vaccine.  3. Take the prescription to your pharmacy.  The pharmacist will administer the vaccination.  The pharmacist will bill your Medicare plan.**  * Zostavax is not covered 100% by Medicare Part B plans.  Your co-pays may be from none to $100.  Please contact your insurance or pharmacist to find out if your Medicare Part B plan provides any coverage for Zostavax. **Consider the time of year if you may be reaching your Part D plans coverage limit (Donut hole).  Bone Densitometry Bone densitometry is an imaging test that uses a special X-ray to measure the amount of calcium and other minerals in your bones (bone density). This test is also known as a bone mineral density test or dual-energy X-ray absorptiometry (DXA). The test can measure bone density at your hip and your spine. It is similar to having a regular X-ray. You may have this test to:  Diagnose a condition that causes weak or thin bones (osteoporosis).  Predict your risk of a broken bone (fracture).  Determine how well osteoporosis treatment is working. LET [facility] CARE PROVIDER KNOW ABOUT:  Any allergies you have.  All medicines you are taking, including vitamins, herbs, eye drops, creams, and over-the-counter medicines.  Previous problems you or members of your family have had with the  use of anesthetics.  Any blood disorders you have.  Previous surgeries you have had.  Medical conditions you have.  Possibility of pregnancy.  Any other medical test you had within the previous 14 days that used contrast material. RISKS AND COMPLICATIONS Generally, this is a safe procedure. However, problems can occur and may include the following:  This test exposes you to a very small amount of radiation.  The risks of radiation exposure may be greater to unborn children. BEFORE THE PROCEDURE  Do not take any  calcium supplements for 24 hours before having the test. You can otherwise eat and drink what you usually do.  Take off all metal jewelry, eyeglasses, dental appliances, and any other metal objects. PROCEDURE  You may lie on an exam table. There will be an X-ray generator below you and an imaging device above you.  Other devices, such as boxes or braces, may be used to position your body properly for the scan.  You will need to lie still while the machine slowly scans your body.  The images will show up on a computer monitor. AFTER THE PROCEDURE You may need more testing at a later time.   This information is not intended to replace advice given to you by your health care provider. Make sure you discuss any questions you have with your health care provider.   Document Released: 10/14/2004 Document Revised: 10/13/2014 Document Reviewed: 03/02/2014 Elsevier Interactive Patient Education 2016 Elsevier Inc.  Fat and Cholesterol Restricted Diet Getting too much fat and cholesterol in your diet may cause health problems. Following this diet helps keep your fat and cholesterol at normal levels. This can keep you from getting sick. WHAT TYPES OF FAT SHOULD I CHOOSE?  Choose monosaturated and polyunsaturated fats. These are found in foods such as olive oil, canola oil, flaxseeds, walnuts, almonds, and seeds.  Eat more omega-3 fats. Good choices include salmon, mackerel, sardines, tuna, flaxseed oil, and ground flaxseeds.  Limit saturated fats. These are in animal products such as meats, butter, and cream. They can also be in plant products such as palm oil, palm kernel oil, and coconut oil.   Avoid foods with partially hydrogenated oils in them. These contain trans fats. Examples of foods that have trans fats are stick margarine, some tub margarines, cookies, crackers, and other baked goods. WHAT GENERAL GUIDELINES DO I NEED TO FOLLOW?   Check food labels. Look for the words "trans fat" and  "saturated fat."  When preparing a meal:  Fill half of your plate with vegetables and green salads.  Fill one fourth of your plate with whole grains. Look for the word "whole" as the first word in the ingredient list.  Fill one fourth of your plate with lean protein foods.  Limit fruit to two servings a day. Choose fruit instead of juice.  Eat more foods with soluble fiber. Examples of foods with this type of fiber are apples, broccoli, carrots, beans, peas, and barley. Try to get 20-30 g (grams) of fiber per day.  Eat more home-cooked foods. Eat less at restaurants and buffets.  Limit or avoid alcohol.  Limit foods high in starch and sugar.  Limit fried foods.  Cook foods without frying them. Baking, boiling, grilling, and broiling are all great options.  Lose weight if you are overweight. Losing even a small amount of weight can help your overall health. It can also help prevent diseases such as diabetes and heart disease. WHAT FOODS CAN I EAT? Grains Whole  grains, such as whole wheat or whole grain breads, crackers, cereals, and pasta. Unsweetened oatmeal, bulgur, barley, quinoa, or brown rice. Corn or whole wheat flour tortillas. Vegetables Fresh or frozen vegetables (raw, steamed, roasted, or grilled). Green salads. Fruits All fresh, canned (in natural juice), or frozen fruits. Meat and Other Protein Products Ground beef (85% or leaner), grass-fed beef, or beef trimmed of fat. Skinless chicken or [place]. Ground chicken or [place]. Pork trimmed of fat. All fish and seafood. Eggs. Dried beans, peas, or lentils. Unsalted nuts or seeds. Unsalted canned or dry beans. Dairy Low-fat dairy products, such as skim or 1% milk, 2% or reduced-fat cheeses, low-fat ricotta or cottage cheese, or plain low-fat yogurt. Fats and Oils Tub margarines without trans fats. Light or reduced-fat mayonnaise and salad dressings. Avocado. Olive, canola, sesame, or safflower oils. Natural peanut or almond  butter (choose ones without added sugar and oil). The items listed above may not be a complete list of recommended foods or beverages. Contact your dietitian for more options. WHAT FOODS ARE NOT RECOMMENDED? Grains White bread. White pasta. White rice. Cornbread. Bagels, pastries, and croissants. Crackers that contain trans fat. Vegetables White potatoes. Corn. Creamed or fried vegetables. Vegetables in a cheese sauce. Fruits Dried fruits. Canned fruit in light or heavy syrup. Fruit juice. Meat and Other Protein Products Fatty cuts of meat. Ribs, chicken wings, bacon, sausage, bologna, salami, chitterlings, fatback, hot dogs, bratwurst, and packaged luncheon meats. Liver and organ meats. Dairy Whole or 2% milk, cream, half-and-half, and cream cheese. Whole milk cheeses. Whole-fat or sweetened yogurt. Full-fat cheeses. Nondairy creamers and whipped toppings. Processed cheese, cheese spreads, or cheese curds. Sweets and Desserts Corn syrup, sugars, honey, and molasses. Candy. Jam and jelly. Syrup. Sweetened cereals. Cookies, pies, cakes, donuts, muffins, and ice cream. Fats and Oils Butter, stick margarine, lard, shortening, ghee, or bacon fat. Coconut, palm kernel, or palm oils. Beverages Alcohol. Sweetened drinks (such as sodas, lemonade, and fruit drinks or punches). The items listed above may not be a complete list of foods and beverages to avoid. Contact your dietitian for more information.   This information is not intended to replace advice given to you by your health care provider. Make sure you discuss any questions you have with your health care provider.   Document Released: 03/23/2012 Document Revised: 10/13/2014 Document Reviewed: 12/22/2013 Elsevier Interactive Patient Education 2016 Elsevier Inc.  Glaucoma Glaucoma happens when the fluid pressure in the eyeball is too high. If the pressure stays high for too long, the eye may become damaged. This can cause a loss of vision.  The most common type of glaucoma causes pressure in the eye to go up slowly. There may be no symptoms at first. Testing for this condition can help to find the condition before damage occurs. Early treatment can often stop vision loss. HOME CARE  Take medicines only as told by your doctor.  Use your eye drops exactly as told. You will probably need to use these for the rest of your life.  Exercise often. Talk with your doctor about which types of exercise are safe for you. Avoid standing on your head.  Keep all follow-up visits as told by your doctor. This is important. GET HELP IF:  Your symptoms get worse. GET HELP RIGHT AWAY IF:  You have bad pain in your eye.  You have vision problems.  You have a bad headache in the area around your eye.  You feel sick to your stomach (nauseous) or you  throw up (vomit).  You start to have problems with your other eye.   This information is not intended to replace advice given to you by your health care provider. Make sure you discuss any questions you have with your health care provider.   Document Released: 07/01/2008 Document Revised: 10/13/2014 Document Reviewed: 07/04/2014 Elsevier Interactive Patient Education [employer].

## 2015-08-09 NOTE — Progress Notes (Signed)
Subjective:   Melanie Riddle is a 64 y.o. female who presents for an Initial Medicare Annual Wellness Visit.         Objective:    Today's Vitals   08/09/15 1441  BP: 133/69  Pulse: 49  Temp: 98.8 F (37.1 C)  TempSrc: Oral  Height: 5\' 2"  (1.575 m)  Weight: 157 lb 3 oz (71.3 kg)  PainSc: 0-No pain    Current Medications (verified) Outpatient Encounter Prescriptions as of 08/09/2015  Medication Sig  . Lactobacillus (ACIDOPHILUS PO) Take 1 tablet by mouth daily.  . Multiple Minerals-Vitamins (CALCIUM-MAGNESIUM-ZINC-D3 PO) Take 1 tablet by mouth daily.  . Multiple Vitamins-Minerals (HAIR/SKIN/NAILS PO) Take by mouth daily at 2 PM daily at 2 PM.  . Omega-3 Fatty Acids (FISH OIL) 1200 MG CAPS Take 1 capsule by mouth daily.  . [DISCONTINUED] Ascorbic Acid (VITAMIN C) 1000 MG tablet Take 1,000 mg by mouth daily.  . [DISCONTINUED] diltiazem (CARDIZEM) 30 MG tablet Take 1 to 2 tabs every 6 hours as needed. (Patient not taking: Reported on 08/09/2015)  . [DISCONTINUED] flecainide (TAMBOCOR) 100 MG tablet If still in A. Fib take 3 tabs by mouth once. (Patient not taking: Reported on 08/09/2015)  . [DISCONTINUED] rivaroxaban (XARELTO) 20 MG TABS tablet Only take 1 pill per day for 1 week. (Patient not taking: Reported on 08/09/2015)  . [DISCONTINUED] Tdap (BOOSTRIX) 5-2.5-18.5 LF-MCG/0.5 injection Inject 0.5 mLs into the muscle once. (Patient not taking: Reported on 09/27/2014)  . [DISCONTINUED] zoster vaccine live, PF, (ZOSTAVAX) 67619 UNT/0.65ML injection Inject 19,400 Units into the skin once. (Patient not taking: Reported on 09/27/2014)   No facility-administered encounter medications on file as of 08/09/2015.    Allergies (verified) Penicillins   History: Past Medical History  Diagnosis Date  . Allergy   . Sickle cell trait ([place])   . Paroxysmal atrial fibrillation ([place]) 06/28/14    single episodes occured during recovery post colonoscopy   Past Surgical History  Procedure  Laterality Date  . Cesarean section    . Tumor excision Right     benign right thigh  . Salpingectomy Right    Family History  Problem Relation Age of Onset  . Kidney disease Father   . Sickle cell anemia Brother   . Hyperlipidemia Brother   . Stroke Brother   . Cancer Maternal Aunt     lung cancer  . Colon cancer Neg Hx    Social History   Occupational History  . Not on file.   Social History Main Topics  . Smoking status: Former Research scientist (life sciences)  . Smokeless tobacco: Never Used  . Alcohol Use: Yes     Comment: wine 3-7 per week  . Drug Use: No  . Sexual Activity: Yes       Activities of Daily Living In your present state of health, do you have any difficulty performing the following activities: 08/09/2015  Hearing? N     Difficulty concentrating or making decisions? N  Walking or climbing stairs? N  Dressing or bathing? N  Doing errands, shopping? N    Immunizations and Health Maintenance Immunization History  Administered Date(s) Administered  . Influenza Split 09/13/2012  . Influenza,inj,Quad PF,36+ Mos 07/13/2013, 07/12/2014, 08/09/2015  . Pneumococcal Conjugate-13 08/03/2014   Health Maintenance Due  Topic Date Due  . Hepatitis C Screening  [birth year]  . TETANUS/TDAP  03/14/1968  . ZOSTAVAX  03/14/2009  . DEXA SCAN  03/14/2014  . INFLUENZA VACCINE  05/07/2015  . PNA  vac Low Risk Adult (2 of 2 - PPSV23) 08/04/2015    Patient Care Team: Vivi Barrack, MD as PCP - General (Family Medicine)  Indicate any recent Medical Services you may have received from other than [facility] providers in the past year (date may be approximate).     Assessment:   This is a routine wellness examination for Melanie Riddle.   Hearing/Vision screen  Hearing Screening   Method: Audiometry   125Hz  250Hz  500Hz  1000Hz  2000Hz  4000Hz  8000Hz   Right ear:   Pass Pass Pass Pass   Left ear:   Pass Pass Pass Pass   Comments: @40dBHL . [place]   Visual Acuity Screening   Right eye Left eye Both eyes    Without correction: 20/120 20/50 20/50  With correction:       Dietary issues and exercise activities discussed:    Goals    . Weight < 147 lb (66.679 kg)     Would like to lose 10-15 pounds.  Exercising at gym.  Avoiding sugar in diet.       Depression Screen PHQ 2/9 Scores 08/09/2015 03/08/2014 03/17/2012  PHQ - 2 Score 0 0 0    Fall Risk Fall Risk  08/09/2015 02/08/2013  Falls in the past year? No No    Cognitive Function: No flowsheet data found.  Screening Tests Health Maintenance  Topic Date Due  . Hepatitis C Screening  03-28-1951  . TETANUS/TDAP  03/14/1968  . ZOSTAVAX  03/14/2009  . DEXA SCAN  03/14/2014  . INFLUENZA VACCINE  05/07/2015  . MAMMOGRAM  09/04/2016  . COLONOSCOPY  09/27/2024      Plan:     During the course of the visit, Herbert was educated and counseled about the following appropriate screening and preventive services:   Vaccines to include Td, Zostavax  Bone density screening  Diabetes screening  Glaucoma screening  Patient Instructions (the written plan) were given to the patient.    MCDIARMID,TODD D, MD   08/09/2015         Subjective:   Melanie Riddle is a 64 y.o. female who presents for an Initial Medicare Annual Wellness Visit.      Objective:    Today's Vitals   08/09/15 1441  BP: 133/69  Pulse: 49  Temp: 98.8 F (37.1 C)  TempSrc: Oral  Height: 5\' 2"  (1.575 m)  Weight: 157 lb 3 oz (71.3 kg)  PainSc: 0-No pain    Current Medications (verified) Outpatient Encounter Prescriptions as of 08/09/2015  Medication Sig  . cholecalciferol (VITAMIN D) 1000 UNITS tablet Take 1 tablet (1,000 Units total) by mouth daily.  . Lactobacillus (ACIDOPHILUS PO) Take 1 tablet by mouth daily.  . Multiple Minerals-Vitamins (CALCIUM-MAGNESIUM-ZINC-D3 PO) Take 1 tablet by mouth daily.  . Multiple Vitamins-Minerals (HAIR/SKIN/NAILS PO) Take by mouth daily at 2 PM daily at 2 PM.  . Omega-3 Fatty Acids (FISH OIL) 1200 MG CAPS Take 1  capsule by mouth daily.  . [DISCONTINUED] Ascorbic Acid (VITAMIN C) 1000 MG tablet Take 1,000 mg by mouth daily.  . [DISCONTINUED] diltiazem (CARDIZEM) 30 MG tablet Take 1 to 2 tabs every 6 hours as needed. (Patient not taking: Reported on 08/09/2015)  . [DISCONTINUED] flecainide (TAMBOCOR) 100 MG tablet If still in A. Fib take 3 tabs by mouth once. (Patient not taking: Reported on 08/09/2015)  . [DISCONTINUED] rivaroxaban (XARELTO) 20 MG TABS tablet Only take 1 pill per day for 1 week. (Patient not taking: Reported on 08/09/2015)  . [DISCONTINUED] Tdap ([place]) 5-2.5-18.5 LF-MCG/0.5  injection Inject 0.5 mLs into the muscle once. (Patient not taking: Reported on 09/27/2014)  . [DISCONTINUED] zoster vaccine live, PF, (ZOSTAVAX) 53614 UNT/0.65ML injection Inject 19,400 Units into the skin once. (Patient not taking: Reported on 09/27/2014)  . [DISCONTINUED] zoster vaccine live, PF, (ZOSTAVAX) 43154 UNT/0.65ML injection Inject 19,400 Units into the skin once.   No facility-administered encounter medications on file as of 08/09/2015.    Allergies (verified) Penicillins   History: Past Medical History  Diagnosis Date  . Allergy   . Sickle cell trait ([place])   . Paroxysmal atrial fibrillation ([place]) 06/28/14    single episodes occured during recovery post colonoscopy  . Seasonal allergies 03/17/2012  . Ischemic optic neuropathy of right eye 03/17/2012    Asked for optometry note.   . Atrial fibrillation ([place]) 09/28/2014  . Arthritis of hand 04/07/2012  . Decreased visual acuity 08/10/2015   Past Surgical History  Procedure Laterality Date  . Cesarean section    . Tumor excision Right     benign right thigh, [place] [facility]  . Salpingectomy Right 2005    Dr Ronnald Ramp   Family History  Problem Relation Age of Onset  . Kidney disease Father   . Sickle cell anemia Brother   . Hyperlipidemia Brother   . Stroke Brother   . Cancer Maternal Aunt     lung cancer  . Colon cancer Neg Hx    Social History    Occupational History  . Not on file.   Social History Main Topics  . Smoking status: Former Research scientist (life sciences)  . Smokeless tobacco: Never Used  . Alcohol Use: Yes     Comment: wine 3-7 per week  . Drug Use: No  . Sexual Activity: Yes    Tobacco Counseling Not necessary   Activities of Daily Living In your present state of health, do you have any difficulty performing the following activities: 08/09/2015  Hearing? N  Vision? (No Data)  Difficulty concentrating or making decisions? N  Walking or climbing stairs? N  Dressing or bathing? N  Doing errands, shopping? N    Immunizations and Health Maintenance Immunization History  Administered Date(s) Administered  . Influenza Split 09/13/2012  . Influenza,inj,Quad PF,36+ Mos 07/13/2013, 07/12/2014, 08/09/2015  . Pneumococcal Conjugate-13 08/03/2014  . Pneumococcal Polysaccharide-23 08/09/2015   Health Maintenance Due  Topic Date Due  . Hepatitis C Screening  1951/09/20  . TETANUS/TDAP  03/14/1968  . ZOSTAVAX  03/14/2009  . DEXA SCAN  03/14/2014    Patient Care Team: Vivi Barrack, MD as PCP - General (Family Medicine)  Indicate any recent Medical Services you may have received from other than [facility] providers in the past year (date may be approximate).     Assessment:   This is a routine wellness examination for Melanie Riddle.   Hearing/Vision screen  Hearing Screening   Method: Audiometry   125Hz  250Hz  500Hz  1000Hz  2000Hz  4000Hz  8000Hz   Right ear:   Pass Pass Pass Pass   Left ear:   Pass Pass Pass Pass   Comments: @40dBHL . [place]   Visual Acuity Screening   Right eye Left eye Both eyes  Without correction: 20/120 20/50 20/50  With correction:       Dietary issues and exercise activities discussed:yes, portion control.  Declined referral for nutritionist.     Goals    . Weight < 147 lb (66.679 kg)     Would like to lose 10-15 pounds.  Exercising at gym.  Avoiding sugar in diet.  Depression Screen PHQ 2/9 Scores  08/09/2015 03/08/2014 03/17/2012  PHQ - 2 Score 0 0 0    Fall Risk Fall Risk  08/09/2015 02/08/2013  Falls in the past year? No No    Cognitive Function: MiniCog: Passed    Screening Tests Health Maintenance  Topic Date Due  . Hepatitis C Screening  May 25, 1951  . TETANUS/TDAP  03/14/1968  . ZOSTAVAX  03/14/2009  . DEXA SCAN  03/14/2014  . INFLUENZA VACCINE  05/06/2016  . MAMMOGRAM  09/04/2016  . COLONOSCOPY  09/27/2024  . PNA vac Low Risk Adult  Completed      Plan:    During the course of the visit, Melanie Riddle was educated and counseled about the following appropriate screening and preventive services:   Vaccines to include  Zostavax (Patient given Rx for Zostavax with instruction how to obtain from her Pharmacy), Encouraged Tdap though she will have a 20% co-insurance for both vaccines because she [place].   Bone density screening- Patient agreed to DEXA.  It has been ordered.  Glaucoma screening: Recommended patient go for ophthalmologic exam given that she is at increased risk of glaucoma because of ethnicity and she has moderate to severe vision decreased acuity right greater than left.   Nutrition counseling  Patient Instructions (the written plan) were given to the patient.    MCDIARMID,TODD D, MD   08/10/2015

## 2015-08-10 ENCOUNTER — Encounter: Payer: Self-pay | Admitting: Family Medicine

## 2015-08-10 DIAGNOSIS — H547 Unspecified visual loss: Secondary | ICD-10-CM

## 2015-08-10 HISTORY — DX: Unspecified visual loss: H54.7

## 2015-08-10 LAB — HIV ANTIBODY (ROUTINE TESTING W REFLEX): HIV 1&2 Ab, 4th Generation: NONREACTIVE

## 2015-08-10 LAB — HEPATITIS C ANTIBODY: HCV Ab: REACTIVE — AB

## 2015-08-13 ENCOUNTER — Encounter: Payer: Self-pay | Admitting: Family Medicine

## 2015-08-13 DIAGNOSIS — B1920 Unspecified viral hepatitis C without hepatic coma: Secondary | ICD-10-CM

## 2015-08-13 DIAGNOSIS — B182 Chronic viral hepatitis C: Secondary | ICD-10-CM | POA: Insufficient documentation

## 2015-08-13 HISTORY — DX: Unspecified viral hepatitis C without hepatic coma: B19.20

## 2015-08-13 LAB — HEPATITIS C RNA QUANTITATIVE
HCV QUANT: 3606328 [IU]/mL — AB (ref <15)
HCV Quantitative Log: 6.56 {Log} — ABNORMAL HIGH (ref <1.18)

## 2015-08-13 NOTE — Addendum Note (Signed)
Addended byWendy Poet, Vonnie Spagnolo D on: 08/13/2015 01:47 PM   Modules accepted: Orders

## 2015-08-13 NOTE — Assessment & Plan Note (Signed)
HCV Antibody positive (08/09/15) on screening for Annual Wellness Visit.

## 2015-08-14 ENCOUNTER — Telehealth: Payer: Self-pay | Admitting: Family Medicine

## 2015-08-14 NOTE — Telephone Encounter (Signed)
Advised pt as directed below and stated that she has to look at her schedule and will callback to schedule FU with Dr. Jerline Pain in the next 2-3 weeks. If pt calls back please schedule a FU appt with Dr. Jerline Pain in the next 2-3 weeks. Linsey Arteaga, CMA.

## 2015-08-14 NOTE — Telephone Encounter (Signed)
I asked Melanie Riddle to come into [facility] to see Dr Jerline Pain to discuss her viral Hepatitis screening test results.   She said she would. Please schedule Melanie Riddle to see Dr Jerline Pain within the next 2 to 3 weeks.  Please let her know the date and time of the appointment with Dr Jerline Pain.

## 2015-08-15 NOTE — Telephone Encounter (Signed)
Spoke with patient and she plans to come by tomorrow to pick up results. Melanie Riddle,CMA

## 2015-08-15 NOTE — Telephone Encounter (Signed)
Would like copy of latest blood test results

## 2015-08-20 NOTE — Progress Notes (Signed)
Phone call requesting patient to schedule a time with her PCP to discuss labs from her Annual Wellness Visit.

## 2015-09-10 ENCOUNTER — Ambulatory Visit: Payer: Medicare Other | Admitting: Family Medicine

## 2015-09-28 ENCOUNTER — Institutional Professional Consult (permissible substitution): Payer: Medicare Other | Admitting: Internal Medicine

## 2015-12-19 ENCOUNTER — Ambulatory Visit (INDEPENDENT_AMBULATORY_CARE_PROVIDER_SITE_OTHER): Payer: Medicare Other | Admitting: Family Medicine

## 2015-12-19 ENCOUNTER — Encounter: Payer: Self-pay | Admitting: Family Medicine

## 2015-12-19 ENCOUNTER — Other Ambulatory Visit (HOSPITAL_COMMUNITY)
Admission: RE | Admit: 2015-12-19 | Discharge: 2015-12-19 | Disposition: A | Discharge status: Home / Self Care | Payer: Medicare Other | Source: Ambulatory Visit | Attending: Family Medicine | Admitting: Family Medicine

## 2015-12-19 VITALS — BP 128/64 | HR 51 | Temp 98.4°F | Wt 154.0 lb

## 2015-12-19 DIAGNOSIS — Z113 Encounter for screening for infections with a predominantly sexual mode of transmission: Secondary | ICD-10-CM | POA: Insufficient documentation

## 2015-12-19 DIAGNOSIS — R399 Unspecified symptoms and signs involving the genitourinary system: Secondary | ICD-10-CM | POA: Diagnosis not present

## 2015-12-19 DIAGNOSIS — N9489 Other specified conditions associated with female genital organs and menstrual cycle: Secondary | ICD-10-CM | POA: Diagnosis not present

## 2015-12-19 DIAGNOSIS — B1920 Unspecified viral hepatitis C without hepatic coma: Secondary | ICD-10-CM | POA: Diagnosis not present

## 2015-12-19 DIAGNOSIS — N949 Unspecified condition associated with female genital organs and menstrual cycle: Secondary | ICD-10-CM | POA: Diagnosis not present

## 2015-12-19 LAB — POCT URINALYSIS DIPSTICK
Bilirubin, UA: NEGATIVE
Blood, UA: NEGATIVE
GLUCOSE UA: NEGATIVE
LEUKOCYTES UA: NEGATIVE
Nitrite, UA: NEGATIVE
PROTEIN UA: NEGATIVE
Spec Grav, UA: 1.015
Urobilinogen, UA: 1
pH, UA: 6

## 2015-12-19 LAB — POCT WET PREP (WET MOUNT): CLUE CELLS WET PREP WHIFF POC: NEGATIVE

## 2015-12-19 LAB — COMPREHENSIVE METABOLIC PANEL
ALBUMIN: 3.7 g/dL (ref 3.6–5.1)
ALK PHOS: 102 U/L (ref 33–130)
ALT: 75 U/L — ABNORMAL HIGH (ref 6–29)
AST: 111 U/L — ABNORMAL HIGH (ref 10–35)
BILIRUBIN TOTAL: 1.3 mg/dL — AB (ref 0.2–1.2)
BUN: 11 mg/dL (ref 7–25)
CO2: 29 mmol/L (ref 20–31)
Calcium: 9.6 mg/dL (ref 8.6–10.4)
Chloride: 102 mmol/L (ref 98–110)
Creat: 0.64 mg/dL (ref 0.50–0.99)
GLUCOSE: 86 mg/dL (ref 65–99)
Potassium: 4.1 mmol/L (ref 3.5–5.3)
Sodium: 138 mmol/L (ref 135–146)
TOTAL PROTEIN: 6.6 g/dL (ref 6.1–8.1)

## 2015-12-19 NOTE — Patient Instructions (Addendum)
Abscess An abscess is an infected area that contains a collection of pus and debris.It can occur in almost any part of the body. An abscess is also known as a furuncle or boil. CAUSES  An abscess occurs when tissue gets infected. This can occur from blockage of oil or sweat glands, infection of hair follicles, or a minor injury to the skin. As the body tries to fight the infection, pus collects in the area and creates pressure under the skin. This pressure causes pain. People with weakened immune systems have difficulty fighting infections and get certain abscesses more often.  SYMPTOMS Usually an abscess develops on the skin and becomes a painful mass that is red, warm, and tender. If the abscess forms under the skin, you may feel a moveable soft area under the skin. Some abscesses break open (rupture) on their own, but most will continue to get worse without care. The infection can spread deeper into the body and eventually into the bloodstream, causing you to feel ill.  DIAGNOSIS  Your caregiver will take your medical history and perform a physical exam. A sample of fluid may also be taken from the abscess to determine what is causing your infection. TREATMENT  Your caregiver may prescribe antibiotic medicines to fight the infection. However, taking antibiotics alone usually does not cure an abscess. Your caregiver may need to make a small cut (incision) in the abscess to drain the pus. In some cases, gauze is packed into the abscess to reduce pain and to continue draining the area. HOME CARE INSTRUCTIONS   Only take over-the-counter or prescription medicines for pain, discomfort, or fever as directed by your caregiver.  If you were prescribed antibiotics, take them as directed. Finish them even if you start to feel better.  If gauze is used, follow your caregiver's directions for changing the gauze.  To avoid spreading the infection:  Keep your draining abscess covered with a  bandage.  Wash your hands well.  Do not share personal care items, towels, or whirlpools with others.  Avoid skin contact with others.  Keep your skin and clothes clean around the abscess.  Keep all follow-up appointments as directed by your caregiver. SEEK MEDICAL CARE IF:   You have increased pain, swelling, redness, fluid drainage, or bleeding.  You have muscle aches, chills, or a general ill feeling.  You have a fever. MAKE SURE YOU:   Understand these instructions.  Will watch your condition.  Will get help right away if you are not doing well or get worse.   This information is not intended to replace advice given to you by your health care provider. Make sure you discuss any questions you have with your health care provider.   Document Released: 07/02/2005 Document Revised: 03/23/2012 Document Reviewed: 12/05/2011 Elsevier Interactive Patient Education 2016 Elsevier Inc. Hepatitis C Hepatitis C is a viral infection of the liver. It can lead to scarring of the liver (cirrhosis), liver failure, or liver cancer. Hepatitis C may go undetected for months or years because people with the infection may not have symptoms, or they may have only mild symptoms. CAUSES  Hepatitis C is caused by the hepatitis C virus (HCV). The virus can be passed from one person to another through:  Blood.  Contaminated needles, such as those used for tattooing, body piercing, acupuncture, or injecting drugs.  Having unprotected sex with an infected person.  Childbirth.  Blood transfusions or organ transplants done in the [place] before 1992. RISK FACTORS  Risk factors for hepatitis C include:  Having unprotected sex with an infected person.  Using illegal drugs. SIGNS AND SYMPTOMS  Symptoms of hepatitis C may include:  Fatigue.  Loss of appetite.  Nausea.  Vomiting.  Abdominal pain.  Dark yellow urine.  Yellowish skin and eyes (jaundice).  Itching of the  skin.  Clay-colored bowel movements.  Joint pain. Symptoms are not always present.  DIAGNOSIS  Hepatitis C is diagnosed with blood tests. Other types of tests may also be done to check how your liver is functioning. TREATMENT  Your health care provider may perform noninvasive tests or a liver biopsy to help determine the best course of treatment. Treatment for hepatitis C may include one or more medicines. Your health care provider may check you for a recurring infection or other liver conditions every 6-12 months after treatment. HOME CARE INSTRUCTIONS   Rest as needed.  Take all medicines as directed by your health care provider.  Do not take any medicine unless approved by your health care provider. This includes over-the-counter medicine and birth control pills.  Do not drink alcohol.  Do not have sex until approved by your health care provider.  Do not share toothbrushes, nail clippers, razors, or needles. PREVENTION There is no vaccine for hepatitis C. The only way to prevent the disease is to reduce the risk of exposure to the virus. This may be done by:  Practicing safe sex and using condoms.  Avoiding illegal drugs. SEEK MEDICAL CARE IF:  You have a fever.  You develop abdominal pain.  You develop dark urine.  You have clay-colored bowel movements.  You develop joint pains. SEEK IMMEDIATE MEDICAL CARE IF:  You have increasing fatigue or weakness.  You lose your appetite.  You feel nauseous or vomit.  You develop jaundice or your jaundice gets worse.  You bruise or bleed easily. MAKE SURE YOU:   Understand these instructions.  Will watch your condition.  Will get help right away if you are not doing well or get worse.   This information is not intended to replace advice given to you by your health care provider. Make sure you discuss any questions you have with your health care provider.   Document Released: 09/19/2000 Document Revised: 10/13/2014  Document Reviewed: 01/04/2014 Elsevier Interactive Patient Education [employer].

## 2015-12-19 NOTE — Progress Notes (Signed)
Subjective:    Patient ID: Melanie Riddle is a 65 y.o. female presenting with Dysuria and vaginal irritation  on 12/19/2015  HPI: Had UTI noted last year.  Took OTC Azo and this left lesion on back. She is unwilling to try this. Symptoms of 4 day h/o vaginal irritation and burning. Notes pimples in vagina and some mild abdominal bloating. Craving carbs and salt. Just does not feel well. Has more frequent yeast vs. UTI infections lately. Menopause at age 73. Remains sexually active. No h/o herpes. Patient did not understand her Hepatitis C was positive in 08/2015.  Review of Systems  Constitutional: Negative for fever and chills.  Respiratory: Negative for shortness of breath.   Cardiovascular: Negative for chest pain and leg swelling.  Gastrointestinal: Negative for nausea, vomiting, abdominal pain, diarrhea and constipation.  Genitourinary: Positive for genital sores and vaginal pain. Negative for dysuria, hematuria and vaginal discharge.       Urine is dark      Objective:    BP 128/64 mmHg  Pulse 51  Temp(Src) 98.4 F (36.9 C) (Oral)  Wt 154 lb (69.854 kg)  SpO2 99% Physical Exam  Constitutional: She is oriented to person, place, and time. She appears well-developed and well-nourished. No distress.  HENT:  Head: Normocephalic and atraumatic.  Eyes: No scleral icterus.  Neck: Neck supple.  Cardiovascular: Normal rate.   Pulmonary/Chest: Effort normal.  Abdominal: Soft.  Genitourinary:  External genitalia reveal single blister with erythema on left side. BUS normal, vagina is pale and atrophic, cervix is nulliparous without lesion, uterus is small and anteverted, no adnexal mass or tenderness.   Neurological: She is alert and oriented to person, place, and time.  Skin: Skin is warm and dry.  Psychiatric: She has a normal mood and affect.   Urinalysis    Component Value Date/Time   BILIRUBINUR NEG 12/19/2015 1409   PROTEINUR NEG 12/19/2015 1409   UROBILINOGEN 1.0  12/19/2015 1409   NITRITE NEG 12/19/2015 1409   LEUKOCYTESUR Negative 12/19/2015 1409   Source Wet Prep POC  VAG   WBC, Wet Prep HPF POC  1-5   Bacteria Wet Prep HPF POC None, Few, Too numerous to count  Many (A)   Clue Cells Wet Prep HPF POC None, Too numerous to count  None   Clue Cells Wet Prep Whiff POC  Negative Whiff   Yeast Wet Prep HPF POC  Many   Trichomonas Wet Prep HPF POC  NONE            Assessment & Plan:   Problem List Items Addressed This Visit      Unprioritized   Unspecified viral hepatitis C without hepatic coma    HCV positive with high viral load-->referral to ID       Relevant Orders   Comprehensive metabolic panel   Ambulatory referral to Infectious Disease    Other Visit Diagnoses    UTI symptoms    -  Primary    Relevant Orders    POCT urinalysis dipstick (Completed)    Labial pain        Blister -check HSV culture--suspect abscess given no history but if recurs presume HSV. Sitz baths and comfort care.    Relevant Orders    POCT Wet Prep [facility]) (Completed)    Cervicovaginal ancillary only    Herpes simplex virus culture       Total face-to-face time with patient: 25 minutes. Over 50% of encounter was spent on  counseling and coordination of care. Return if symptoms worsen or fail to improve.  Coady Train S 12/19/2015 2:27 PM

## 2015-12-19 NOTE — Assessment & Plan Note (Signed)
HCV positive with high viral load-->referral to ID

## 2015-12-19 NOTE — Addendum Note (Signed)
Addended by: Donnamae Jude on: 12/19/2015 07:20 PM   Modules accepted: Level of Service

## 2015-12-20 LAB — CERVICOVAGINAL ANCILLARY ONLY
CHLAMYDIA, DNA PROBE: NEGATIVE
Neisseria Gonorrhea: NEGATIVE

## 2015-12-21 ENCOUNTER — Telehealth: Payer: Self-pay | Admitting: *Deleted

## 2015-12-21 LAB — HERPES SIMPLEX VIRUS CULTURE: Organism ID, Bacteria: NOT DETECTED

## 2015-12-21 NOTE — Telephone Encounter (Signed)
-----   Message from Donnamae Jude, MD sent at 12/21/2015  4:31 PM EDT ----- Her herpes is negative. Please inform patient.

## 2015-12-21 NOTE — Telephone Encounter (Signed)
Spoke with patient and she is aware of results. Melanie Riddle,CMA  

## 2015-12-24 ENCOUNTER — Encounter: Payer: Self-pay | Admitting: Family Medicine

## 2015-12-24 ENCOUNTER — Ambulatory Visit (INDEPENDENT_AMBULATORY_CARE_PROVIDER_SITE_OTHER): Payer: Medicare Other | Admitting: Family Medicine

## 2015-12-24 VITALS — BP 132/76 | HR 67 | Temp 97.8°F | Wt 153.9 lb

## 2015-12-24 DIAGNOSIS — Z23 Encounter for immunization: Secondary | ICD-10-CM | POA: Diagnosis not present

## 2015-12-24 DIAGNOSIS — E2839 Other primary ovarian failure: Secondary | ICD-10-CM | POA: Diagnosis not present

## 2015-12-24 DIAGNOSIS — R7989 Other specified abnormal findings of blood chemistry: Secondary | ICD-10-CM

## 2015-12-24 DIAGNOSIS — R799 Abnormal finding of blood chemistry, unspecified: Secondary | ICD-10-CM | POA: Diagnosis not present

## 2015-12-24 DIAGNOSIS — Z0000 Encounter for general adult medical examination without abnormal findings: Secondary | ICD-10-CM

## 2015-12-24 DIAGNOSIS — R945 Abnormal results of liver function studies: Secondary | ICD-10-CM

## 2015-12-24 DIAGNOSIS — I4891 Unspecified atrial fibrillation: Secondary | ICD-10-CM

## 2015-12-24 DIAGNOSIS — B1920 Unspecified viral hepatitis C without hepatic coma: Secondary | ICD-10-CM

## 2015-12-24 LAB — CBC
HEMATOCRIT: 42.6 % (ref 36.0–46.0)
HEMOGLOBIN: 14.1 g/dL (ref 12.0–15.0)
MCH: 30.2 pg (ref 26.0–34.0)
MCHC: 33.1 g/dL (ref 30.0–36.0)
MCV: 91.2 fL (ref 78.0–100.0)
MPV: 12.6 fL — AB (ref 8.6–12.4)
Platelets: 210 10*3/uL (ref 150–400)
RBC: 4.67 MIL/uL (ref 3.87–5.11)
RDW: 13.4 % (ref 11.5–15.5)
WBC: 7.7 10*3/uL (ref 4.0–10.5)

## 2015-12-24 MED ORDER — TETANUS-DIPHTH-ACELL PERTUSSIS 5-2.5-18.5 LF-MCG/0.5 IM SUSP: 0.5000 mL | Freq: Once | INTRAMUSCULAR | Status: DC

## 2015-12-24 NOTE — Assessment & Plan Note (Signed)
Will check CBC and lipid panel today. Will also order DEXA scan.

## 2015-12-24 NOTE — Assessment & Plan Note (Signed)
Referral to ID placed. HCV positive with detectable viral load.

## 2015-12-24 NOTE — Assessment & Plan Note (Signed)
Noted on exam today. CHADs2Vasc score of 2. Had discussion with patient regarding starting anticoagulation, however patient deferred. Patient voiced understanding that not having anticoagulation increases her risk for stroke.

## 2015-12-24 NOTE — Progress Notes (Signed)
    Subjective:  Melanie Riddle is a 65 y.o. female who presents to the [facility] today with a chief complaint of hepatitis C infection   HPI:  Hepatitis C Infection Patient was noted to be positive for hepatitis C last year. Patient was not aware of this until last week. She has not received any treatment for this. Her HIV test was negative.  Atrial Fibrillation Patient with chronic afib. She has decided to not take anticoagulation due to the side effects listed. She is aware that she is at an increased risk of stroke due to this condition and not being on anticoagulation.   Healthcare Maintenance Patient due for DEXA scan. Recently had Shingles vaccine at her pharmacy.   ROS: Per HPI  Objective:  Physical Exam: BP 132/76 mmHg  Pulse 67  Temp(Src) 97.8 F (36.6 C) (Oral)  Wt 153 lb 14.4 oz (69.809 kg)  Gen: NAD, resting comfortably CV: Irregularly irregular. No murmurs appreciated Pulm: NWOB, CTAB with no crackles, wheezes, or rhonchi GI: Normal bowel sounds present. Soft, Nontender, Nondistended. MSK: no edema, cyanosis, or clubbing noted Skin: warm, dry Neuro: grossly normal, moves all extremities Psych: Normal affect and thought content  Assessment/Plan:  Unspecified viral hepatitis C without hepatic coma Referral to ID placed. HCV positive with detectable viral load.   Atrial fibrillation Noted on exam today. CHADs2Vasc score of 2. Had discussion with patient regarding starting anticoagulation, however patient deferred. Patient voiced understanding that not having anticoagulation increases her risk for stroke.   Healthcare maintenance Will check CBC and lipid panel today. Will also order DEXA scan.   Algis Greenhouse. Jerline Pain, [facility] Resident PGY-2 12/24/2015 3:38 PM

## 2015-12-24 NOTE — Patient Instructions (Signed)
It was nice meeting you today!  We will refer you to the infectious disease doctors to get treated for hepatitis C.  We will set you up to have a DEXA scan.  We will check your blood counts and cholesterol today.  Having atrial fibrillation increases your risk for having a stroke. If you change your mind about blood thinners, please let us know.  Please come back in 6-12 months, or sooner if you need anything else.  Take care,  Dr Jerline Pain

## 2015-12-25 ENCOUNTER — Encounter: Payer: Self-pay | Admitting: Family Medicine

## 2015-12-25 LAB — LIPID PANEL
CHOL/HDL RATIO: 3.1 ratio (ref <=5.0)
CHOLESTEROL: 147 mg/dL (ref 125–200)
HDL: 47 mg/dL (ref >=46)
LDL Cholesterol: 82 mg/dL (ref <130)
Triglycerides: 88 mg/dL (ref <150)
VLDL: 18 mg/dL (ref <30)

## 2015-12-28 NOTE — Addendum Note (Signed)
Addended by: Vivi Barrack on: 12/28/2015 04:29 PM   Modules accepted: Miquel Dunn

## 2016-01-10 ENCOUNTER — Ambulatory Visit
Admission: RE | Admit: 2016-01-10 | Discharge: 2016-01-10 | Disposition: A | Discharge status: Home / Self Care | Payer: Medicare Other | Source: Ambulatory Visit | Attending: Family Medicine | Admitting: Family Medicine

## 2016-01-10 DIAGNOSIS — Z13820 Encounter for screening for osteoporosis: Secondary | ICD-10-CM | POA: Diagnosis not present

## 2016-01-10 DIAGNOSIS — E2839 Other primary ovarian failure: Secondary | ICD-10-CM

## 2016-01-10 DIAGNOSIS — Z780 Asymptomatic menopausal state: Secondary | ICD-10-CM | POA: Diagnosis not present

## 2016-01-11 ENCOUNTER — Encounter: Payer: Self-pay | Admitting: Family Medicine

## 2016-02-06 ENCOUNTER — Other Ambulatory Visit: Payer: Self-pay | Admitting: Internal Medicine

## 2016-02-06 ENCOUNTER — Other Ambulatory Visit: Payer: Medicare Other

## 2016-02-06 DIAGNOSIS — B1710 Acute hepatitis C without hepatic coma: Secondary | ICD-10-CM

## 2016-02-06 DIAGNOSIS — Z0000 Encounter for general adult medical examination without abnormal findings: Secondary | ICD-10-CM

## 2016-02-06 DIAGNOSIS — B182 Chronic viral hepatitis C: Secondary | ICD-10-CM | POA: Diagnosis not present

## 2016-02-06 DIAGNOSIS — Z7251 High risk heterosexual behavior: Secondary | ICD-10-CM

## 2016-02-06 LAB — PROTIME-INR
INR: 1.17 (ref <1.50)
PROTHROMBIN TIME: 15 s (ref 11.6–15.2)

## 2016-02-06 LAB — IRON: Iron: 141 ug/dL (ref 45–160)

## 2016-02-07 LAB — ANA: Anti Nuclear Antibody(ANA): NEGATIVE

## 2016-02-07 LAB — HEPATITIS B SURFACE ANTIGEN: Hepatitis B Surface Ag: NEGATIVE

## 2016-02-07 LAB — HEPATITIS A ANTIBODY, TOTAL: Hep A Total Ab: REACTIVE — AB

## 2016-02-07 LAB — HEPATITIS B CORE ANTIBODY, TOTAL: HEP B C TOTAL AB: NONREACTIVE

## 2016-02-07 LAB — HIV ANTIBODY (ROUTINE TESTING W REFLEX): HIV 1&2 Ab, 4th Generation: NONREACTIVE

## 2016-02-07 LAB — HEPATITIS B SURFACE ANTIBODY,QUALITATIVE: Hep B S Ab: NEGATIVE

## 2016-02-10 LAB — HCV RNA, QUANT REAL-TIME PCR W/REFLEX
HCV RNA, PCR, QN (LOG): 6.25 {Log_IU}/mL — AB
HCV RNA, PCR, QN: 1770000 IU/mL — ABNORMAL HIGH

## 2016-02-10 LAB — HCV RNA,LIPA RFLX NS5A DRUG RESIST

## 2016-03-05 ENCOUNTER — Encounter: Payer: Self-pay | Admitting: Internal Medicine

## 2016-03-05 ENCOUNTER — Ambulatory Visit (INDEPENDENT_AMBULATORY_CARE_PROVIDER_SITE_OTHER): Payer: Medicare Other | Admitting: Internal Medicine

## 2016-03-05 VITALS — BP 138/84 | HR 57 | Temp 97.9°F | Ht 62.0 in | Wt 156.0 lb

## 2016-03-05 DIAGNOSIS — B182 Chronic viral hepatitis C: Secondary | ICD-10-CM

## 2016-03-05 DIAGNOSIS — Z23 Encounter for immunization: Secondary | ICD-10-CM

## 2016-03-05 MED ORDER — LEDIPASVIR-SOFOSBUVIR 90-400 MG PO TABS: 1.0000 | ORAL_TABLET | Freq: Every day | ORAL | Status: DC

## 2016-03-05 NOTE — Progress Notes (Signed)
[place] for Infectious Disease   CC: consideration for treatment for chronic hepatitis C  HPI:  +Melanie Riddle is a 65 y.o. female who presents for initial evaluation and management of chronic hepatitis C.  Patient tested positive earlier this year as part of a screening exam. Hepatitis C-associated risk factors present are: tattoos (details: small rose on leg). Patient denies history of blood transfusion, intranasal drug use, IV drug abuse, renal dialysis, sexual contact with person with liver disease. Patient has had other studies performed. Results: hepatitis C RNA by PCR, result: positive. Patient has not had prior treatment for Hepatitis C. Patient does not have a past history of liver disease. Patient does not have a family history of liver disease. Patient does not  have associated signs or symptoms related to liver disease.  Labs reviewed and confirm chronic hepatitis C with a positive viral load.   Records reviewed from PCP, screened due to age group.   Denies herbal medication, no acid medication.     Patient does have documented immunity to Hepatitis A. Patient does not have documented immunity to Hepatitis B.    Review of Systems:   Constitutional: negative for fatigue and malaise Gastrointestinal: negative for diarrhea Musculoskeletal: negative for myalgias and arthralgias All other systems reviewed and are negative      Past Medical History  Diagnosis Date  . Allergy   . Sickle cell trait ([place])   . Paroxysmal atrial fibrillation ([place]) 06/28/14    single episodes occured during recovery post colonoscopy  . Seasonal allergies 03/17/2012  . Ischemic optic neuropathy of right eye 03/17/2012    Asked for optometry note.   . Atrial fibrillation ([place]) 09/28/2014  . Arthritis of hand 04/07/2012  . Decreased visual acuity 08/10/2015  . Unspecified viral hepatitis C without hepatic coma 08/13/2015    Prior to Admission medications   Medication Sig Start Date End Date  Taking? Authorizing Provider  cholecalciferol (VITAMIN D) 1000 UNITS tablet Take 1 tablet (1,000 Units total) by mouth daily. 08/10/15  Yes Blane Ohara McDiarmid, MD  Multiple Minerals-Vitamins (CALCIUM-MAGNESIUM-ZINC-D3 PO) Take 1 tablet by mouth daily.   Yes Historical Provider, MD  Ledipasvir-Sofosbuvir (HARVONI) 90-400 MG TABS Take 1 tablet by mouth daily. 03/05/16   Thayer Headings, MD    Allergies  Allergen Reactions  . Penicillins Swelling    Social History  Substance Use Topics  . Smoking status: Former Research scientist (life sciences)  . Smokeless tobacco: Never Used  . Alcohol Use: 0.0 oz/week    0 Standard drinks or equivalent per week     Comment: wine 3-7 per week    Family History  Problem Relation Age of Onset  . Kidney disease Father   . Sickle cell anemia Brother   . Hyperlipidemia Brother   . Stroke Brother   . Cancer Maternal Aunt     lung cancer  . Colon cancer Neg Hx   no cirrhosis, no liver cancer   Objective:  Constitutional: in no apparent distress and alert,  Filed Vitals:   03/05/16 0850  BP: 138/84  Pulse: 57  Temp: 97.9 F (36.6 C)   Eyes: anicteric Cardiovascular: Cor irreg, irreg RRR Respiratory: CTA B; normal respiratory effort Gastrointestinal: Bowel sounds are normal, liver is not enlarged, spleen is not enlarged Musculoskeletal: no pedal edema noted Skin: negatives: no rash; no porphyria cutanea tarda Lymphatic: no cervical lymphadenopathy   Laboratory Genotype: No results found for: HCVGENOTYPE HCV viral load:  Lab Results  Component Value Date  HCVQUANT [id number]* 08/09/2015   Lab Results  Component Value Date   WBC 7.7 12/24/2015   HGB 14.1 12/24/2015   HCT 42.6 12/24/2015   MCV 91.2 12/24/2015   PLT 210 12/24/2015    Lab Results  Component Value Date   CREATININE 0.64 12/19/2015   BUN 11 12/19/2015   NA 138 12/19/2015   K 4.1 12/19/2015   CL 102 12/19/2015   CO2 29 12/19/2015    Lab Results  Component Value Date   ALT 75* 12/19/2015   AST  111* 12/19/2015   ALKPHOS 102 12/19/2015     Labs and history reviewed and show CHILD-PUGH A  5-6 points: Child class A 7-9 points: Child class B 10-15 points: Child class C  Lab Results  Component Value Date   INR 1.17 02/06/2016   BILITOT 1.3* 12/19/2015   ALBUMIN 3.7 12/19/2015     Assessment: New Patient with Chronic Hepatitis C genotype 1b, untreated.  I discussed with the patient the lab findings that confirm chronic hepatitis C as well as the natural history and progression of disease including about 30% of people who develop cirrhosis of the liver if left untreated and once cirrhosis is established there is a 2-7% risk per year of liver cancer and liver failure.  I discussed the importance of treatment and benefits in reducing the risk, even if significant liver fibrosis exists.   Plan: 1) Patient counseled extensively on limiting acetaminophen to no more than 2 grams daily, avoidance of alcohol. 2) Transmission discussed with patient including sexual transmission, sharing razors and toothbrush.   3) Will need referral to gastroenterology if concern for cirrhosis 4) Will need referral for substance abuse counseling: No.; Further work up to include urine drug screen  No. 5) Will prescribe Harvoni for 12 weeks 6) Hepatitis A vaccine No. 7) Hepatitis B vaccine Yes.   8) Pneumovax vaccine if concern for cirrhosis 9) Further work up to include liver staging with elastography 10) will follow up after starting medication

## 2016-03-05 NOTE — Addendum Note (Signed)
Addended by: Myrtis Hopping A on: 03/05/2016 11:19 AM   Modules accepted: Orders

## 2016-03-05 NOTE — Patient Instructions (Signed)
Date 03/05/2016  Dear Ms Ronnald Ramp, As discussed in the [facility], your hepatitis C therapy will include the following medications:          Harvoni 90mg /400mg  tablet:           Take 1 tablet by mouth once daily   Please note that ALL MEDICATIONS WILL START ON THE SAME DATE for a total of 12 weeks. ---------------------------------------------------------------- Your HCV Treatment Start Date: TBA   Your HCV genotype:  1b    Liver Fibrosis: TBD    ---------------------------------------------------------------- YOUR PHARMACY CONTACT:   [place] [facility] [place] Phone: [phone] Hours: Monday to Friday 7:30 am to 6:00 pm   Please always contact your pharmacy at least 3-4 business days before you run out of medications to ensure your next month's medication is ready or 1 week prior to running out if you receive it by mail.  Remember, each prescription is for 28 days. ---------------------------------------------------------------- GENERAL NOTES REGARDING YOUR HEPATITIS C MEDICATION:  SOFOSBUVIR/LEDIPASVIR (HARVONI): - Harvoni tablet is taken daily with OR without food. - The tablets are orange. - The tablets should be stored at room temperature.  - Acid reducing agents such as H2 blockers (ie. Pepcid (famotidine), Zantac (ranitidine), Tagamet (cimetidine), Axid (nizatidine) and proton pump inhibitors (ie. Prilosec (omeprazole), Protonix (pantoprazole), Nexium (esomeprazole), or Aciphex (rabeprazole)) can decrease effectiveness of Harvoni. Do not take until you have discussed with a health care provider.    -Antacids that contain magnesium and/or aluminum hydroxide (ie. Milk of [place], Rolaids, Gaviscon, Maalox, Mylanta, an dArthritis Pain Formula)can reduce absorption of Harvoni, so take them at least 4 hours before or after Harvoni.  -Calcium carbonate (calcium supplements or antacids such as Tums, Caltrate,  Os-Cal)needs to be taken at least 4 hours hours before or after Harvoni.  -St. John's wort or any products that contain St. John's wort like some herbal supplements  Please inform the office prior to starting any of these medications.  - The common side effects associated with Harvoni include:      1. Fatigue      2. Headache      3. Nausea      4. Diarrhea      5. Insomnia  Please note that this only lists the most common side effects and is NOT a comprehensive list of the potential side effects of these medications. For more information, please review the drug information sheets that come with your medication package from the pharmacy.  ---------------------------------------------------------------- GENERAL HELPFUL HINTS ON HCV THERAPY: 1. Stay well-hydrated. 2. Notify the ID Clinic of any changes in your other over-the-counter/herbal or prescription medications. 3. If you miss a dose of your medication, take the missed dose as soon as you remember. Return to your regular time/dose schedule the next day.  4.  Do not stop taking your medications without first talking with your healthcare provider. 5.  You may take Tylenol (acetaminophen), as long as the dose is less than 2000 mg (OR no more than 4 tablets of the Tylenol Extra Strengths 500mg  tablet) in 24 hours. 6.  You will see our pharmacist-specialist within the first 2 weeks of starting your medication. 7.  You will need to obtain routine labs around week 4 and12 weeks after starting and then 3 to 6 months after finishing Harvoni.    Scharlene Gloss, [place] [facility] [place],  [place]  [id number] [phone]

## 2016-03-20 ENCOUNTER — Ambulatory Visit (HOSPITAL_COMMUNITY)
Admission: RE | Admit: 2016-03-20 | Discharge: 2016-03-20 | Disposition: A | Discharge status: Home / Self Care | Payer: Medicare Other | Source: Ambulatory Visit | Attending: Internal Medicine | Admitting: Internal Medicine

## 2016-03-20 DIAGNOSIS — B182 Chronic viral hepatitis C: Secondary | ICD-10-CM | POA: Diagnosis not present

## 2016-03-20 DIAGNOSIS — B1920 Unspecified viral hepatitis C without hepatic coma: Secondary | ICD-10-CM | POA: Diagnosis not present

## 2016-03-24 ENCOUNTER — Encounter: Payer: Self-pay | Admitting: Pharmacy Technician

## 2016-03-24 MED FILL — *HARVONI 90-400 MG TABLET: 90-400 | 28 days supply | Qty: 28 | Fill #0

## 2016-04-14 ENCOUNTER — Ambulatory Visit: Payer: Medicare Other | Admitting: Pharmacist

## 2016-04-14 DIAGNOSIS — B182 Chronic viral hepatitis C: Secondary | ICD-10-CM

## 2016-04-14 NOTE — Progress Notes (Signed)
HPI: Melanie Riddle is a 65 y.o. female who presents to the [place] clinic today for follow up of her Hep C.  She has genotype 1b and started taking Harvoni on June 19th.   No results found for: HCVGENOTYPE, HEPCGENOTYPE  Allergies: Allergies  Allergen Reactions  . Penicillins Swelling    Vitals:    Past Medical History: Past Medical History  Diagnosis Date  . Allergy   . Sickle cell trait ([place])   . Paroxysmal atrial fibrillation ([place]) 06/28/14    single episodes occured during recovery post colonoscopy  . Seasonal allergies 03/17/2012  . Ischemic optic neuropathy of right eye 03/17/2012    Asked for optometry note.   . Atrial fibrillation ([place]) 09/28/2014  . Arthritis of hand 04/07/2012  . Decreased visual acuity 08/10/2015  . Unspecified viral hepatitis C without hepatic coma 08/13/2015    Social History: Social History   Social History  . Marital Status: Single    Spouse Name: N/A  . Number of Children: N/A  . Years of Education: N/A   Social History Main Topics  . Smoking status: Former Research scientist (life sciences)  . Smokeless tobacco: Never Used  . Alcohol Use: 0.0 oz/week    0 Standard drinks or equivalent per week     Comment: wine 3-7 per week  . Drug Use: No  . Sexual Activity:    Partners: Male     Comment: pt declined condoms   Other Topics Concern  . Not on file   Social History Narrative   Health Risk Assessment      Behavioral Risks   Exercise   Greater than 20 minutes/day for greater than 3 days/week: yes, some of the time      [place] with your teeth or dentures: no      Alcohol Use   4 or more alcoholic drinks in a day in the last year: no      Motor Vehicle Safety   Difficulty driving car: no   Seatbelt usage: yes      Medication Adherence   Trouble taking medicines as directed: I don't take medicines      Psychosocial Risks      Loneliness / Social Isolation      Living alone: yes      Someone available to help or talk:yes      Recent limitation of social activity: not at [place] last 4 weeks: excellent      Home safety      Working smoke alarm: yes      Home throw rugs: yes      Non-slip mats in shower or bathtub: yes      Railings on home stairs: Not applicable      Home free from clutter: yes      Persons helping take care of patient at home: No one.          Emergency contact person(s):      Name      Relationship        Contact phone number       Melanie Riddle                    Daughter                          [phone]     Labs: HEP B  S AB (no units)  Date Value  02/06/2016 NEG   HEPATITIS B SURFACE AG (no units)  Date Value  02/06/2016 NEGATIVE   HCV AB (no units)  Date Value  08/09/2015 REACTIVE*    No results found for: HCVGENOTYPE, HEPCGENOTYPE  Hepatitis C RNA quantitative Latest Ref Rng 08/09/2015  HCV Quantitative <15 IU/mL [id number])  HCV Quantitative Log <1.18 log 10 6.56(H)    AST (U/L)  Date Value  12/19/2015 111*  02/08/2013 28   ALT (U/L)  Date Value  12/19/2015 75*  02/08/2013 28   INR (no units)  Date Value  02/06/2016 1.17    CrCl: CrCl cannot be calculated (Unknown ideal weight.).  Fibrosis Score: F2/some F3 as assessed by elastrography   Child-Pugh Score: A  Previous Treatment Regimen: None - treatment naive  Assessment: Melanie Riddle is doing great on her Harvoni so far.  She is starting her 4th week and has not missed any doses.  She does not take any other medications besides a multivitamin.  She does separate her multivitamin and Harvoni - she takes her multivitamin in the morning and Harvoni around 9:30pm at night. She hasn't had any side effects - no nausea, no headaches, no fatigue.  She's doing great. I did explain her elastography results to her and gave her information about my chart, so she could log on and read the test result herself. I also made a follow-up lab appointment and appointment  with Dr. Linus Salmons around the end of treatment.   Recommendations: - Continue taking Harvoni for 12 weeks total - Continue separating your multivitamin and Harvoni - Labs on 8/1 at 9:45am - Follow-up appointment with Dr. Linus Salmons 9/12 at 9:30am - Call [facility] Pharmacist [phone]) if you have any issues  Charmaine Downs, PharmD [facility] 04/14/2016, 9:36 AM

## 2016-04-16 MED FILL — *HARVONI 90-400 MG TABLET: 90-400 | 28 days supply | Qty: 28 | Fill #1

## 2016-05-06 ENCOUNTER — Other Ambulatory Visit: Payer: Medicare Other

## 2016-05-06 DIAGNOSIS — B182 Chronic viral hepatitis C: Secondary | ICD-10-CM

## 2016-05-07 LAB — HEPATITIS C RNA QUANTITATIVE: HCV Quantitative: NOT DETECTED IU/mL (ref <15)

## 2016-05-13 MED FILL — *HARVONI 90-400 MG TABLET: 90-400 | 28 days supply | Qty: 28 | Fill #2

## 2016-06-17 ENCOUNTER — Ambulatory Visit (INDEPENDENT_AMBULATORY_CARE_PROVIDER_SITE_OTHER): Payer: Medicare Other | Admitting: Internal Medicine

## 2016-06-17 ENCOUNTER — Encounter: Payer: Self-pay | Admitting: Internal Medicine

## 2016-06-17 VITALS — BP 118/82 | HR 64 | Temp 98.4°F | Ht 62.0 in | Wt 153.0 lb

## 2016-06-17 DIAGNOSIS — Z23 Encounter for immunization: Secondary | ICD-10-CM | POA: Diagnosis not present

## 2016-06-17 DIAGNOSIS — Z0000 Encounter for general adult medical examination without abnormal findings: Secondary | ICD-10-CM | POA: Diagnosis not present

## 2016-06-17 DIAGNOSIS — B182 Chronic viral hepatitis C: Secondary | ICD-10-CM

## 2016-06-17 DIAGNOSIS — K7400 Hepatic fibrosis, unspecified: Secondary | ICD-10-CM | POA: Insufficient documentation

## 2016-06-17 NOTE — Assessment & Plan Note (Signed)
Discussed results.  No HCC screening indicated.   

## 2016-06-17 NOTE — Assessment & Plan Note (Signed)
Now finished.  End of treatment lab today and SVR 24 in 6 months.

## 2016-06-17 NOTE — Addendum Note (Signed)
Addended by: Myrtis Hopping A on: 06/17/2016 11:27 AM   Modules accepted: Orders

## 2016-06-17 NOTE — Assessment & Plan Note (Signed)
Hep B #2 today and #3 in 6 months when she returns.

## 2016-06-17 NOTE — Progress Notes (Signed)
   Subjective:    Patient ID: Melanie Riddle, female    DOB: Feb 15, 1951, 65 y.o.   MRN: [id number]  HPI Here for follow up of HCV  Has genotype 1b, viral load of 1.7 million, elastography with F2/3.  Hepatitis A immune and B non immune and had #1 so far.  Initial viral load negative and now just finished treatment with Harvoni for 12 weeks.  Some rash that was transient and gone now.  No fatigue, no headache.  Early viral load undetectable.  Occasional wine.  Recent cold.     Review of Systems  Constitutional: Negative for fatigue.  Gastrointestinal: Negative for diarrhea.  Skin: Negative for rash.  Neurological: Negative for dizziness and headaches.       Objective:   Physical Exam  Constitutional: She appears well-developed and well-nourished. No distress.  Eyes: No scleral icterus.  Cardiovascular: Normal rate, regular rhythm and normal heart sounds.   Musculoskeletal: She exhibits no edema.  Skin: No rash noted.   SHx: occasional wine with dinner       Assessment & Plan:

## 2016-06-18 LAB — HEPATITIS C RNA QUANTITATIVE: HCV QUANT: NOT DETECTED [IU]/mL (ref <15)

## 2016-06-24 IMAGING — US US ABDOMEN COMPLETE W/ ELASTOGRAPHY
1 series · 13 of 25 positions shown · non-contrast
Comparison: None.

CLINICAL DATA: Hepatitis-C.  Sickle cell.



[Series 1: us abdomen complete w/ elastography · 0.19mm/px · 13 of 73 slices shown]
[im 1/73]
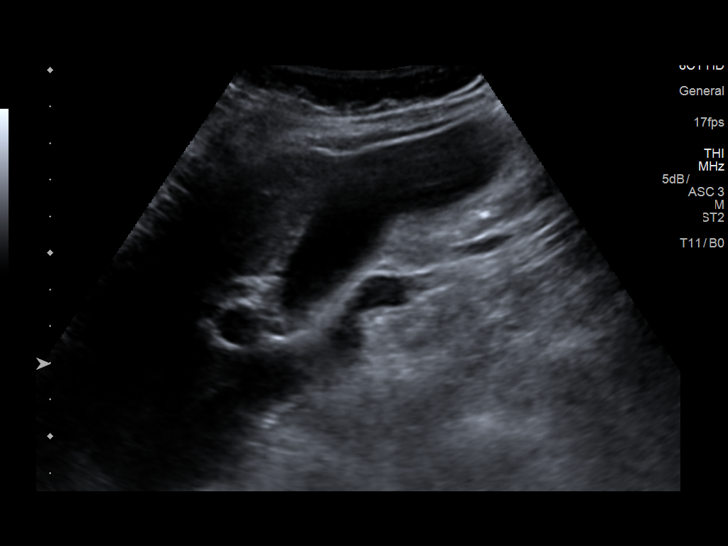
[im 7/73]
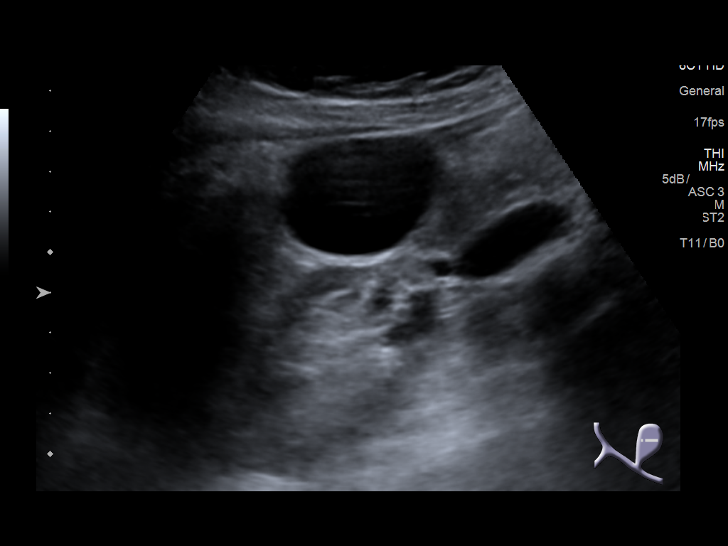
[im 13/73]
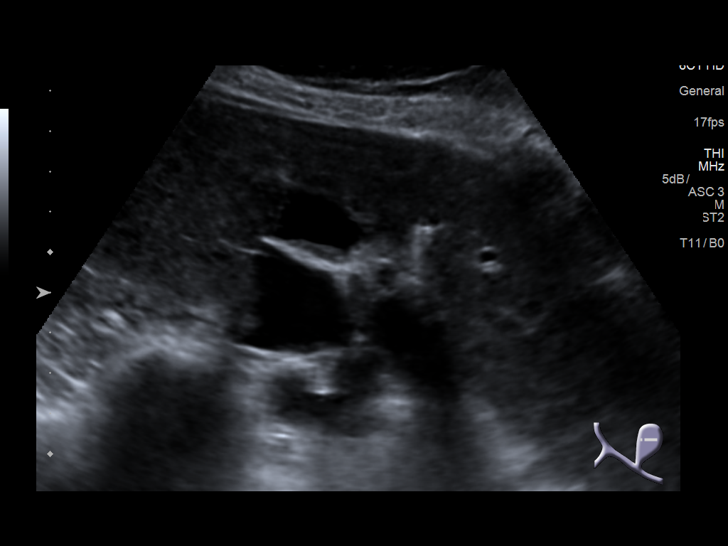
[im 19/73]
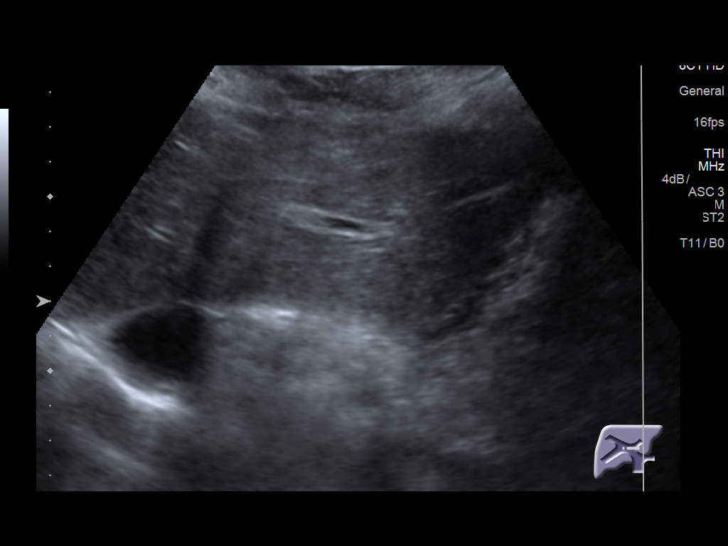
[im 25/73]
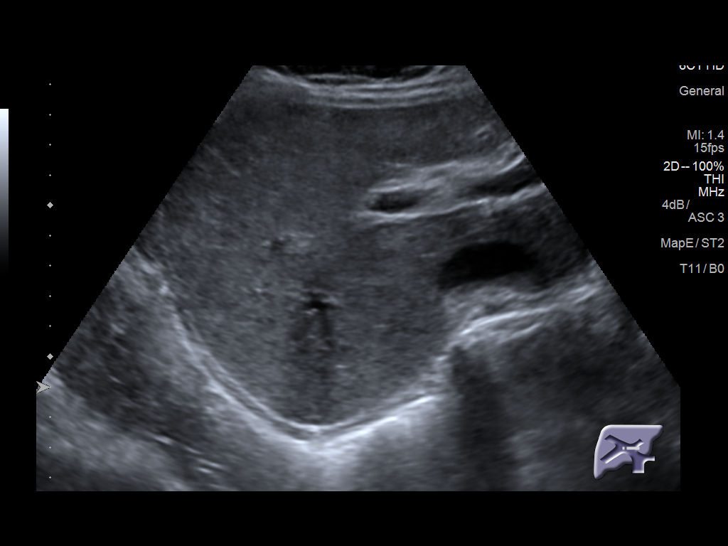
[im 31/73]
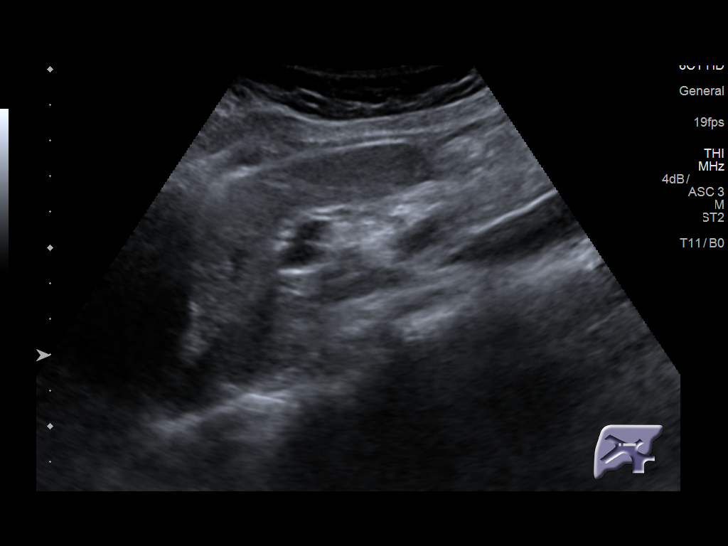
[im 37/73]
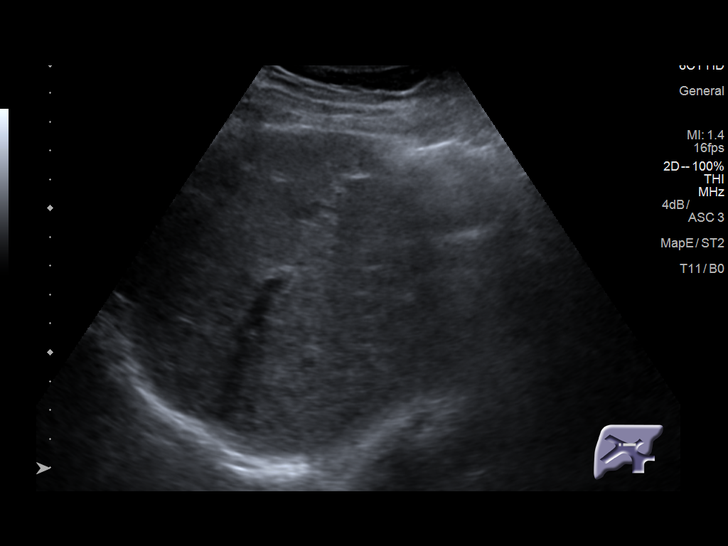
[im 43/73]
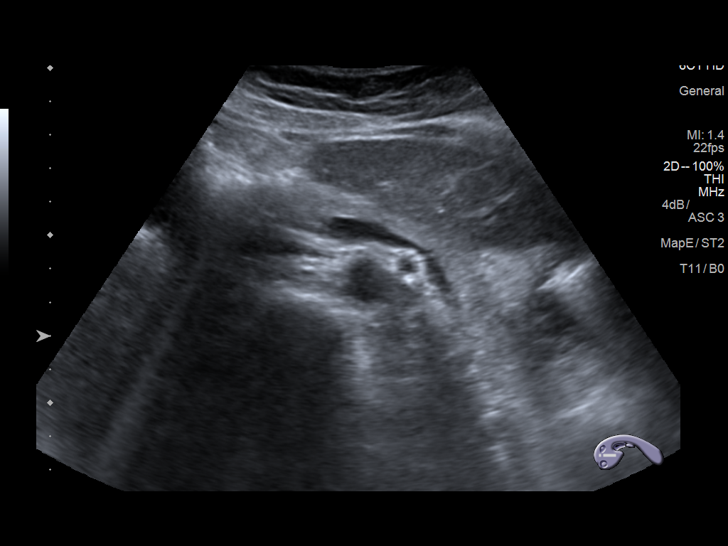
[im 49/73]
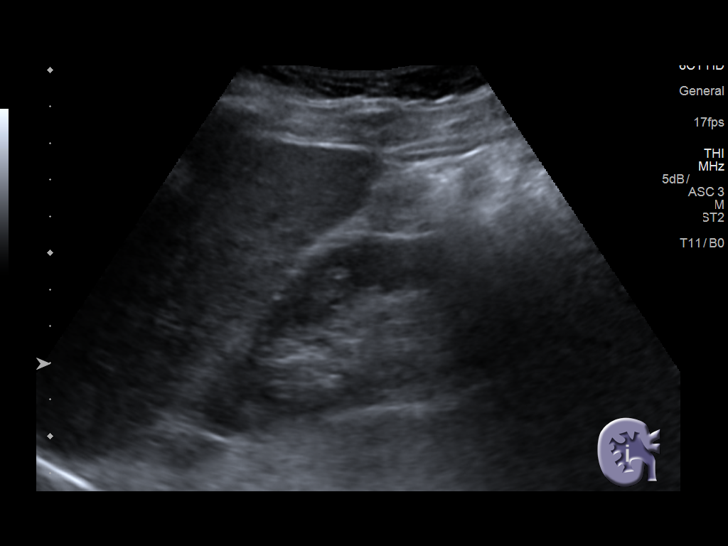
[im 55/73]
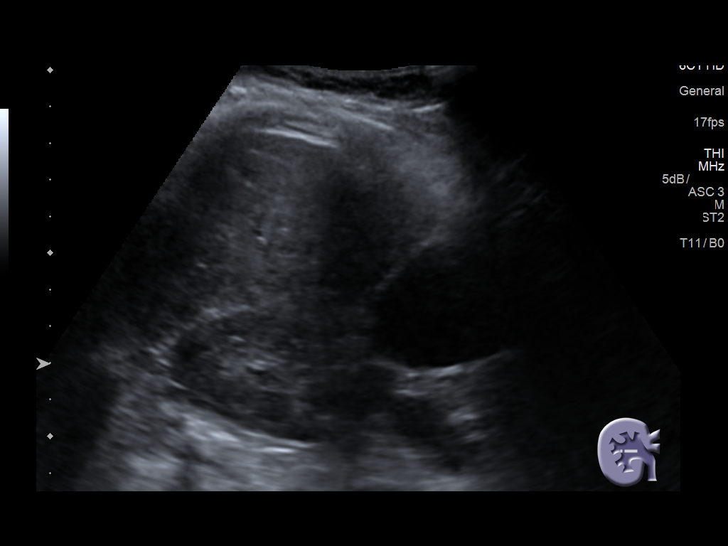
[im 61/73]
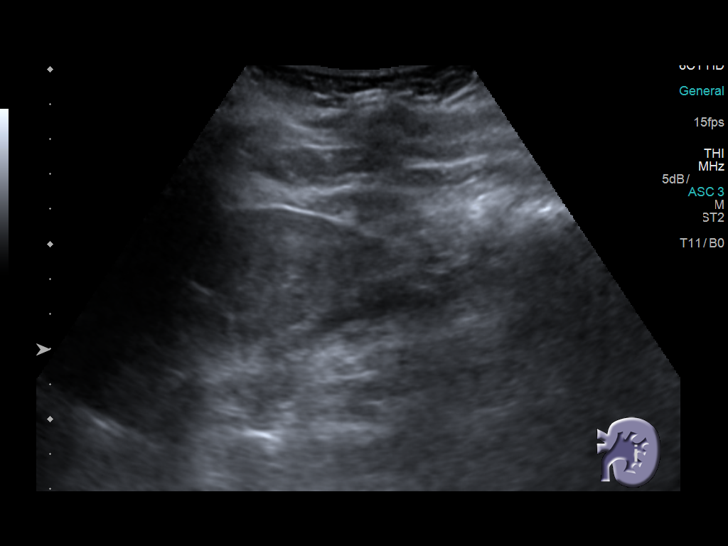
[im 67/73]
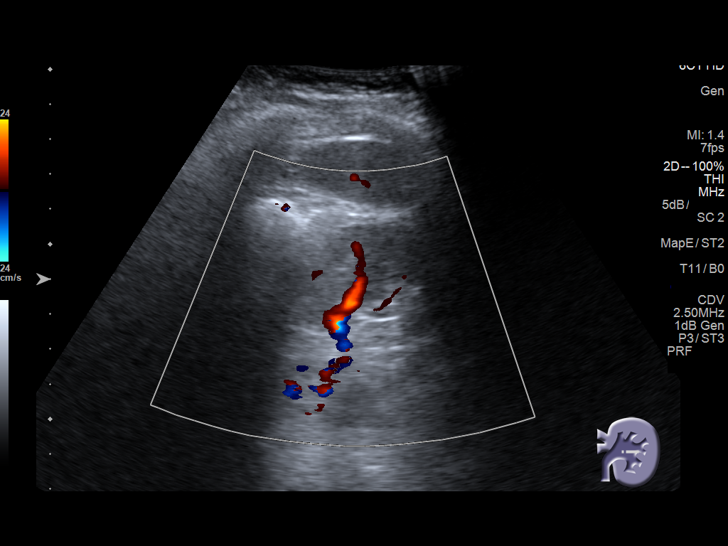
[im 73/73]
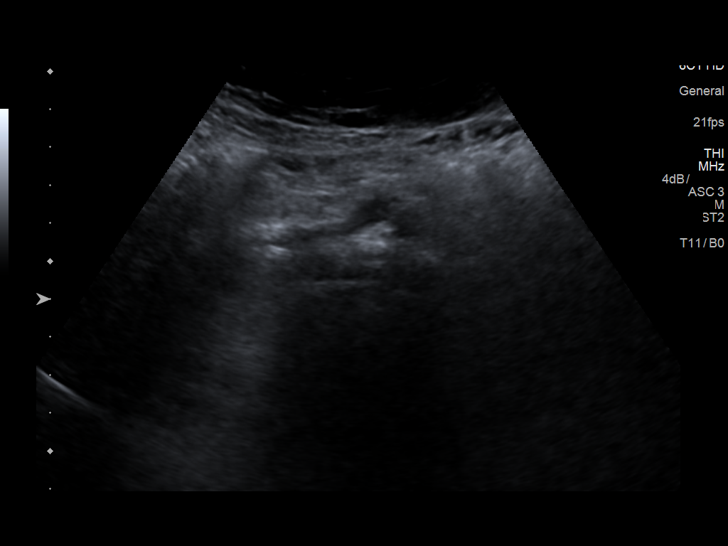

[13 of 25 positions shown; findings below may reference images not displayed]

FINDINGS: ULTRASOUND ABDOMEN

Gallbladder: Gallbladder has a normal appearance. Gallbladder wall
is 1.6 mm, within normal limits. No stones or pericholecystic fluid.
No sonographic Murphy's sign.

Common bile duct: Diameter: 4.4 mm

Liver: No focal lesion identified. Within normal limits in
parenchymal echogenicity.

IVC: No abnormality visualized.

Pancreas: Visualized portion unremarkable.

Spleen: Size and appearance within normal limits.

Right Kidney: Length: 10.5 cm. Echogenicity within normal limits. No
mass or hydronephrosis visualized.

Left Kidney: Length: 10.6 cm post. Echogenicity within normal
limits. No mass or hydronephrosis visualized.

Abdominal aorta: No aneurysm visualized.

Other findings: None.

ULTRASOUND HEPATIC ELASTOGRAPHY

Device: Siemens Helix VTQ

Patient position:  Supine

Transducer 6 C1 HD

Number of measurements:  10

Hepatic Segment:  8

Median velocity:   1.53  m/sec

IQR:

IQR/Median velocity ratio

Corresponding Metavir fibrosis score:  F2 + some F3

Risk of fibrosis: Moderate

Limitations of exam: None

Pertinent findings noted on other imaging exams:  None

Please note that abnormal shear wave velocities may also be
identified in clinical settings other than with hepatic fibrosis,
such as: acute hepatitis, elevated right heart and central venous
pressures including use of beta blockers, Locklear disease
(Topa), infiltrative processes such as
mastocytosis/amyloidosis/infiltrative tumor, extrahepatic
cholestasis, in the post-prandial state, and liver transplantation.
Correlation with patient history, laboratory data, and clinical
condition recommended.
IMPRESSION: 1. No evidence for acute cholecystitis.
2. No focal renal or hepatic abnormality.
3. No hydronephrosis.
4. Median hepatic shear wave velocity is calculated at 1.53 m/sec.
5. Corresponding Metavir fibrosis score is F2 + some F3.
6. Risk of fibrosis is moderate.
7. Follow-up:  Additional testing is appropriate.

## 2016-07-03 ENCOUNTER — Encounter (HOSPITAL_COMMUNITY): Payer: Self-pay

## 2016-07-03 ENCOUNTER — Emergency Department (HOSPITAL_COMMUNITY): Payer: Medicare Other

## 2016-07-03 ENCOUNTER — Observation Stay (HOSPITAL_COMMUNITY)
Admission: EM | Admit: 2016-07-03 | Discharge: 2016-07-04 | Disposition: A | Discharge status: Home / Self Care | Payer: Medicare Other | Attending: Family Medicine | Admitting: Family Medicine

## 2016-07-03 ENCOUNTER — Observation Stay (HOSPITAL_COMMUNITY): Payer: Medicare Other

## 2016-07-03 DIAGNOSIS — I639 Cerebral infarction, unspecified: Secondary | ICD-10-CM | POA: Diagnosis present

## 2016-07-03 DIAGNOSIS — G9389 Other specified disorders of brain: Secondary | ICD-10-CM | POA: Diagnosis not present

## 2016-07-03 DIAGNOSIS — E785 Hyperlipidemia, unspecified: Secondary | ICD-10-CM | POA: Diagnosis not present

## 2016-07-03 DIAGNOSIS — Z87891 Personal history of nicotine dependence: Secondary | ICD-10-CM | POA: Diagnosis not present

## 2016-07-03 DIAGNOSIS — Z841 Family history of disorders of kidney and ureter: Secondary | ICD-10-CM | POA: Diagnosis not present

## 2016-07-03 DIAGNOSIS — H47011 Ischemic optic neuropathy, right eye: Secondary | ICD-10-CM | POA: Diagnosis not present

## 2016-07-03 DIAGNOSIS — I700 Atherosclerosis of aorta: Secondary | ICD-10-CM | POA: Diagnosis not present

## 2016-07-03 DIAGNOSIS — R001 Bradycardia, unspecified: Secondary | ICD-10-CM | POA: Diagnosis not present

## 2016-07-03 DIAGNOSIS — R030 Elevated blood-pressure reading, without diagnosis of hypertension: Secondary | ICD-10-CM | POA: Insufficient documentation

## 2016-07-03 DIAGNOSIS — Z832 Family history of diseases of the blood and blood-forming organs and certain disorders involving the immune mechanism: Secondary | ICD-10-CM | POA: Insufficient documentation

## 2016-07-03 DIAGNOSIS — Z8349 Family history of other endocrine, nutritional and metabolic diseases: Secondary | ICD-10-CM | POA: Insufficient documentation

## 2016-07-03 DIAGNOSIS — Z8673 Personal history of transient ischemic attack (TIA), and cerebral infarction without residual deficits: Secondary | ICD-10-CM

## 2016-07-03 DIAGNOSIS — I6340 Cerebral infarction due to embolism of unspecified cerebral artery: Secondary | ICD-10-CM | POA: Insufficient documentation

## 2016-07-03 DIAGNOSIS — Z9079 Acquired absence of other genital organ(s): Secondary | ICD-10-CM | POA: Insufficient documentation

## 2016-07-03 DIAGNOSIS — Z801 Family history of malignant neoplasm of trachea, bronchus and lung: Secondary | ICD-10-CM | POA: Diagnosis not present

## 2016-07-03 DIAGNOSIS — Z880 Allergy status to penicillin: Secondary | ICD-10-CM | POA: Diagnosis not present

## 2016-07-03 DIAGNOSIS — D573 Sickle-cell trait: Secondary | ICD-10-CM | POA: Diagnosis not present

## 2016-07-03 DIAGNOSIS — I480 Paroxysmal atrial fibrillation: Secondary | ICD-10-CM

## 2016-07-03 DIAGNOSIS — Z9889 Other specified postprocedural states: Secondary | ICD-10-CM | POA: Diagnosis not present

## 2016-07-03 DIAGNOSIS — I2510 Atherosclerotic heart disease of native coronary artery without angina pectoris: Secondary | ICD-10-CM | POA: Insufficient documentation

## 2016-07-03 DIAGNOSIS — R531 Weakness: Secondary | ICD-10-CM | POA: Insufficient documentation

## 2016-07-03 DIAGNOSIS — G459 Transient cerebral ischemic attack, unspecified: Principal | ICD-10-CM

## 2016-07-03 DIAGNOSIS — K740 Hepatic fibrosis: Secondary | ICD-10-CM | POA: Insufficient documentation

## 2016-07-03 DIAGNOSIS — B182 Chronic viral hepatitis C: Secondary | ICD-10-CM | POA: Insufficient documentation

## 2016-07-03 DIAGNOSIS — M19049 Primary osteoarthritis, unspecified hand: Secondary | ICD-10-CM | POA: Diagnosis not present

## 2016-07-03 DIAGNOSIS — R200 Anesthesia of skin: Secondary | ICD-10-CM

## 2016-07-03 DIAGNOSIS — G451 Carotid artery syndrome (hemispheric): Secondary | ICD-10-CM

## 2016-07-03 DIAGNOSIS — R202 Paresthesia of skin: Secondary | ICD-10-CM

## 2016-07-03 DIAGNOSIS — I6329 Cerebral infarction due to unspecified occlusion or stenosis of other precerebral arteries: Secondary | ICD-10-CM | POA: Diagnosis not present

## 2016-07-03 DIAGNOSIS — R29818 Other symptoms and signs involving the nervous system: Secondary | ICD-10-CM | POA: Diagnosis not present

## 2016-07-03 LAB — RAPID URINE DRUG SCREEN, HOSP PERFORMED
AMPHETAMINES: NOT DETECTED
BARBITURATES: NOT DETECTED
Benzodiazepines: NOT DETECTED
Cocaine: NOT DETECTED
OPIATES: NOT DETECTED
TETRAHYDROCANNABINOL: NOT DETECTED

## 2016-07-03 LAB — BASIC METABOLIC PANEL
ANION GAP: 5 (ref 5–15)
BUN: 13 mg/dL (ref 6–20)
CHLORIDE: 105 mmol/L (ref 101–111)
CO2: 28 mmol/L (ref 22–32)
Calcium: 9.1 mg/dL (ref 8.9–10.3)
Creatinine, Ser: 0.66 mg/dL (ref 0.44–1.00)
GFR calc Af Amer: >60 mL/min (ref >60)
GLUCOSE: 98 mg/dL (ref 65–99)
POTASSIUM: 4.3 mmol/L (ref 3.5–5.1)
Sodium: 138 mmol/L (ref 135–145)

## 2016-07-03 LAB — COMPREHENSIVE METABOLIC PANEL
ALBUMIN: 3.7 g/dL (ref 3.5–5.0)
ALK PHOS: 83 U/L (ref 38–126)
ALT: 16 U/L (ref 14–54)
ANION GAP: 7 (ref 5–15)
AST: 23 U/L (ref 15–41)
BUN: 14 mg/dL (ref 6–20)
CALCIUM: 9.2 mg/dL (ref 8.9–10.3)
CO2: 26 mmol/L (ref 22–32)
CREATININE: 0.68 mg/dL (ref 0.44–1.00)
Chloride: 104 mmol/L (ref 101–111)
GFR calc Af Amer: >60 mL/min (ref >60)
GFR calc non Af Amer: >60 mL/min (ref >60)
GLUCOSE: 102 mg/dL — AB (ref 65–99)
Potassium: 4.2 mmol/L (ref 3.5–5.1)
SODIUM: 137 mmol/L (ref 135–145)
Total Bilirubin: 0.8 mg/dL (ref 0.3–1.2)
Total Protein: 7.1 g/dL (ref 6.5–8.1)

## 2016-07-03 LAB — LIPID PANEL
CHOLESTEROL: 146 mg/dL (ref 0–200)
HDL: 45 mg/dL (ref >40)
LDL Cholesterol: 84 mg/dL (ref 0–99)
Total CHOL/HDL Ratio: 3.2 RATIO
Triglycerides: 83 mg/dL (ref <150)
VLDL: 17 mg/dL (ref 0–40)

## 2016-07-03 LAB — URINE MICROSCOPIC-ADD ON
RBC / HPF: NONE SEEN RBC/hpf (ref 0–5)
WBC UA: NONE SEEN WBC/hpf (ref 0–5)

## 2016-07-03 LAB — URINALYSIS, ROUTINE W REFLEX MICROSCOPIC
BILIRUBIN URINE: NEGATIVE
Glucose, UA: NEGATIVE mg/dL
Hgb urine dipstick: NEGATIVE
KETONES UR: NEGATIVE mg/dL
NITRITE: NEGATIVE
PH: 7.5 (ref 5.0–8.0)
PROTEIN: NEGATIVE mg/dL
Specific Gravity, Urine: 1.011 (ref 1.005–1.030)

## 2016-07-03 LAB — DIFFERENTIAL
BASOS ABS: 0 10*3/uL (ref 0.0–0.1)
Basophils Relative: 0 %
Eosinophils Absolute: 0.2 10*3/uL (ref 0.0–0.7)
Eosinophils Relative: 2 %
LYMPHS ABS: 3.4 10*3/uL (ref 0.7–4.0)
LYMPHS PCT: 38 %
Monocytes Absolute: 0.6 10*3/uL (ref 0.1–1.0)
Monocytes Relative: 7 %
NEUTROS ABS: 4.7 10*3/uL (ref 1.7–7.7)
NEUTROS PCT: 53 %

## 2016-07-03 LAB — CBC
HCT: 37.5 % (ref 36.0–46.0)
HEMATOCRIT: 36.5 % (ref 36.0–46.0)
HEMOGLOBIN: 12.7 g/dL (ref 12.0–15.0)
HEMOGLOBIN: 12.4 g/dL (ref 12.0–15.0)
MCH: 30.7 pg (ref 26.0–34.0)
MCH: 30.8 pg (ref 26.0–34.0)
MCHC: 33.9 g/dL (ref 30.0–36.0)
MCHC: 34 g/dL (ref 30.0–36.0)
MCV: 90.6 fL (ref 78.0–100.0)
MCV: 90.8 fL (ref 78.0–100.0)
PLATELETS: 191 10*3/uL (ref 150–400)
Platelets: 173 10*3/uL (ref 150–400)
RBC: 4.14 MIL/uL (ref 3.87–5.11)
RBC: 4.02 MIL/uL (ref 3.87–5.11)
RDW: 12.2 % (ref 11.5–15.5)
RDW: 12.5 % (ref 11.5–15.5)
WBC: 9 10*3/uL (ref 4.0–10.5)
WBC: 8.5 10*3/uL (ref 4.0–10.5)

## 2016-07-03 LAB — I-STAT CHEM 8, ED
BUN: 17 mg/dL (ref 6–20)
CHLORIDE: 101 mmol/L (ref 101–111)
CREATININE: 0.7 mg/dL (ref 0.44–1.00)
Calcium, Ion: 1.12 mmol/L — ABNORMAL LOW (ref 1.15–1.40)
Glucose, Bld: 100 mg/dL — ABNORMAL HIGH (ref 65–99)
HEMATOCRIT: 39 % (ref 36.0–46.0)
Hemoglobin: 13.3 g/dL (ref 12.0–15.0)
Potassium: 4.1 mmol/L (ref 3.5–5.1)
SODIUM: 141 mmol/L (ref 135–145)
TCO2: 29 mmol/L (ref 0–100)

## 2016-07-03 LAB — I-STAT TROPONIN, ED: Troponin i, poc: 0.01 ng/mL (ref 0.00–0.08)

## 2016-07-03 LAB — CBG MONITORING, ED: Glucose-Capillary: 109 mg/dL — ABNORMAL HIGH (ref 65–99)

## 2016-07-03 LAB — APTT: aPTT: 32 seconds (ref 24–36)

## 2016-07-03 LAB — PROTIME-INR
INR: 1.14
PROTHROMBIN TIME: 14.6 s (ref 11.4–15.2)

## 2016-07-03 LAB — TSH: TSH: 0.919 u[IU]/mL (ref 0.350–4.500)

## 2016-07-03 LAB — ETHANOL: Alcohol, Ethyl (B): <5 mg/dL (ref <5)

## 2016-07-03 MED ORDER — INFLUENZA VAC SPLIT QUAD 0.5 ML IM SUSY: 0.5000 mL | PREFILLED_SYRINGE | INTRAMUSCULAR | Status: DC

## 2016-07-03 MED ORDER — ATORVASTATIN CALCIUM 40 MG PO TABS: 40.0000 mg | ORAL_TABLET | Freq: Every day | ORAL | Status: DC

## 2016-07-03 MED ORDER — ATORVASTATIN CALCIUM 20 MG PO TABS
20.0000 mg | ORAL_TABLET | Freq: Every day | ORAL | Status: DC
  Administered 2016-07-03: 20 mg via ORAL
  Filled 2016-07-03: qty 1

## 2016-07-03 MED ORDER — ASPIRIN 325 MG PO TABS
325.0000 mg | ORAL_TABLET | Freq: Every day | ORAL | Status: DC
  Administered 2016-07-03 – 2016-07-04 (×2): 325 mg via ORAL
  Filled 2016-07-03 (×2): qty 1

## 2016-07-03 MED ORDER — IOPAMIDOL (ISOVUE-370) INJECTION 76%
INTRAVENOUS | Status: AC
  Administered 2016-07-03: 50 mL
  Filled 2016-07-03: qty 50

## 2016-07-03 MED ORDER — STROKE: EARLY STAGES OF RECOVERY BOOK
Freq: Once | Status: AC
  Administered 2016-07-04: 11:00:00
  Filled 2016-07-03: qty 1

## 2016-07-03 MED ORDER — SENNOSIDES-DOCUSATE SODIUM 8.6-50 MG PO TABS: 1.0000 | ORAL_TABLET | Freq: Every evening | ORAL | Status: DC | PRN

## 2016-07-03 NOTE — ED Notes (Signed)
Report given to [place], Therapist, sports on 5W

## 2016-07-03 NOTE — ED Notes (Signed)
Pt in radiology at this time. 

## 2016-07-03 NOTE — Progress Notes (Signed)
  FPTS Interim Progress Note  S: Melanie Riddle is feeling well today, she reports the numbness and tingling in her left arm has improved and now just feels it in her 2nd and 3rd fingers. Denies weakness. No chest pain or feelings of palpitation. Denies vision changes and shortness of breath  O: BP (!) 154/60 (BP Location: Left Arm)   Pulse (!) 56   Temp 97.5 F (36.4 C) (Oral)   Resp 18   Ht 5\' 2"  (1.575 m)   Wt 153 lb 1.6 oz (69.4 kg)   SpO2 100%   BMI 28.00 kg/m   General: Pleasant lady, laying in bed in no acute distress Eyes: Conjunctivae non-injected. PERRL.  Cardiovascular: RRR no murmurs rubs or gallops, +2 radial pulses bilaterally Respiratory: No increased WOB. CTAB without wheezing, rhonchi, or crackles noted. Abdomen: +BS, soft, non-distended, non-tender.  MSK: Normal bulk and tone noted. No gross deformities noted. No pitting edema noted. Neuro: A&O x4. Speech clear. 5/5 strength in upper and lower extremities bilaterally  A/P: Left sided numbness and weakness: Very subtle weakness on exam, unsure if this is pathologic or because she is right handed. 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 Patient's risk factor currently is a h/o paroxysmal atrial fibrillation, for which she is not on anticoagulation. Patient's ASCVD score with previous lipid panel is 9.7%.   - continue to observe in telemetry for atrial fibrillation - neurology following, appreciate recommendations - continue ASA - MRI brain pending - echo pending - frequent neuro checks - risk stratification labs- A1c and TSH pending - continue statin Lipitor 40mg  - PT/OT consult  PAF: sinus bradycardia on my exam on admission.  Initially diagnosed during her colonoscopy in 2015- she went bradycardic during the event then into afib with RVR. Cardiology who saw her the day of the event felt this was vagally mediated. She spontaneously reverted back to SR without intervention. She was supposed to f/u with  cardiology in 1 month but never did. She was noted to be irregularly irregular by her PCP in 12/2015 however opted to defer anticoagulation. CHADVASc score was 2, with old infarcts note on CT, it is now 4 making her high risk of stroke in the future.  - monitor on telemetry  - will hold off on heparin gtt given she is within 24hrs of possible stroke, however will need to readdress anticoagulation with her  Elevated BPs: SBPs noted to be in the 130s-150s. Never diagnosed with HTN.  - will continue to monitor - will allow permissive HTN for now.   Hepatitis C with liver fibrosis: sees Dr. Linus Salmons. Elastography with F2/3. Just completed 12 week course of Harvoni with undetectable viral load. - discussed importance of decreasing alcohol consumption - pt to f/u with ID in 6 months for repeat testing.  FEN/GI: Heart healthy diet Prophylaxis: SCDs  Steve Rattler, DO 07/03/2016, 8:11 AM PGY-1, [facility] pager [phone]

## 2016-07-03 NOTE — Progress Notes (Signed)
OT Cancellation Note  Patient Details Name: Melanie Riddle MRN: [id number] DOB: Aug 09, 1951   Cancelled Treatment:    Reason Eval/Treat Not Completed: Patient at procedure or test/ unavailable. Will reattempt as schedule permits.  Hortencia Pilar 07/03/2016, 12:17 PM

## 2016-07-03 NOTE — ED Triage Notes (Signed)
Pt states that about three hours ago her L arm started tingling and feeling numb. Neuro intact, grips equal no slurred speech.

## 2016-07-03 NOTE — ED Notes (Signed)
Pt to CT via stretcher, 2nd IV est in [facility] for CT

## 2016-07-03 NOTE — ED Provider Notes (Signed)
[facility] DEPT Provider Note   CSN: [id number] Arrival date & time: 07/03/16  0201  By signing my name below, I, Johnney Killian, attest that this documentation has been prepared under the direction and in the presence of Orpah Greek, MD. Electronically Signed: Johnney Killian, ED Scribe. 07/03/16. 2:33 AM.   History   Chief Complaint Chief Complaint  Patient presents with  . Numbness    HPI Comments: Melanie Riddle is a 65 y.o. female who presents to the Emergency Department complaining of numbness and tingling of the left hand and arm with onset 3 hours PTA. Pt said that her tingling felt like pins and needles and was intense like pain. Pt says she has experienced these symptoms before but never at night. She says that on baseline these episodes last only a few minutes. Pt says she massaged her arm and hand and applied warm water to her skin in hopes of lessening her symptoms with no resolution. She says she was sitting at her computer at the time of onset. Pt says her arm was "flopping around" and says her arm felt as if it did not belong to her. She says she was not asleep at the time. Pt says she felt associated pain in the base of her neck on the left side. She says she was not asleep at the time of onset. She denies numbness, tingling, or other symptoms in her legs. Pt reports no other symptoms or complaints at this time.   The history is provided by the patient. No language interpreter was used.    Past Medical History:  Diagnosis Date  . Allergy   . Arthritis of hand 04/07/2012  . Atrial fibrillation ([place]) 09/28/2014  . Decreased visual acuity 08/10/2015  . Ischemic optic neuropathy of right eye 03/17/2012   Asked for optometry note.   . Paroxysmal atrial fibrillation ([place]) 06/28/14   single episodes occured during recovery post colonoscopy  . Seasonal allergies 03/17/2012  . Sickle cell trait ([place])   . Unspecified viral hepatitis C without hepatic coma 08/13/2015     Patient Active Problem List   Diagnosis Date Noted  . Liver fibrosis ([place]) 06/17/2016  . Healthcare maintenance 12/24/2015  . Chronic hepatitis C without hepatic coma ([place]) 08/13/2015  . Decreased visual acuity 08/10/2015  . Atrial fibrillation ([place]) 09/28/2014  . Arthritis of hand 04/07/2012  . Ischemic optic neuropathy of right eye 03/17/2012  . Seasonal allergies 03/17/2012    Past Surgical History:  Procedure Laterality Date  . CESAREAN SECTION    . SALPINGECTOMY Right 2005   Dr Ronnald Ramp  . TUMOR EXCISION Right    benign right thigh, [place] [facility]    OB History    No data available       Home Medications    Prior to Admission medications   Medication Sig Start Date End Date Taking? Authorizing Provider  cholecalciferol (VITAMIN D) 1000 UNITS tablet Take 1 tablet (1,000 Units total) by mouth daily. 08/10/15   Blane Ohara McDiarmid, MD  Multiple Minerals-Vitamins (CALCIUM-MAGNESIUM-ZINC-D3 PO) Take 1 tablet by mouth daily.    Historical Provider, MD    Family History Family History  Problem Relation Age of Onset  . Kidney disease Father   . Sickle cell anemia Brother   . Hyperlipidemia Brother   . Stroke Brother   . Cancer Maternal Aunt     lung cancer  . Colon cancer Neg Hx     Social History Social History  Substance Use Topics  .  Smoking status: Former Research scientist (life sciences)  . Smokeless tobacco: Never Used  . Alcohol use 0.0 oz/week     Comment: wine 3-7 per week     Allergies   Penicillins   Review of Systems Review of Systems  Musculoskeletal: Positive for neck pain.  Neurological: Positive for numbness.  All other systems reviewed and are negative.    Physical Exam Updated Vital Signs BP 185/86   Pulse 60   Temp 97.8 F (36.6 C) (Oral)   Resp 14   Ht 5\' 2"  (1.575 m)   Wt 150 lb (68 kg)   SpO2 100%   BMI 27.44 kg/m   Physical Exam  Constitutional: She is oriented to person, place, and time. She appears well-developed and well-nourished. No  distress.  HENT:  Head: Normocephalic and atraumatic.  Right Ear: Hearing normal.  Left Ear: Hearing normal.  Nose: Nose normal.  Mouth/Throat: Oropharynx is clear and moist and mucous membranes are normal.  Eyes: Conjunctivae and EOM are normal. Pupils are equal, round, and reactive to light.  Neck: Normal range of motion. Neck supple.  Cardiovascular: Regular rhythm, S1 normal and S2 normal.  Exam reveals no gallop and no friction rub.   No murmur heard. Pulmonary/Chest: Effort normal and breath sounds normal. No respiratory distress. She exhibits no tenderness.  Abdominal: Soft. Normal appearance and bowel sounds are normal. There is no hepatosplenomegaly. There is no tenderness. There is no rebound, no guarding, no tenderness at McBurney's point and negative Murphy's sign. No hernia.  Musculoskeletal: Normal range of motion.  Neurological: She is alert and oriented to person, place, and time. She has normal strength. No cranial nerve deficit or sensory deficit. GCS eye subscore is 4. GCS verbal subscore is 5. GCS motor subscore is 6.  Subjective decreased sensation of the left arm Left arm ataxia  Skin: Skin is warm, dry and intact. No rash noted. No cyanosis.  Psychiatric: She has a normal mood and affect. Her speech is normal and behavior is normal. Thought content normal.  Nursing note and vitals reviewed.    ED Treatments / Results   DIAGNOSTIC STUDIES: Oxygen Saturation is 100% on RA, normal by my interpretation.    COORDINATION OF CARE: 2:25 AM Discussed treatment plan with pt at bedside and pt agreed to plan.   Labs (all labs ordered are listed, but only abnormal results are displayed) Labs Reviewed  CBG MONITORING, ED - Abnormal; Notable for the following:       Result Value   Glucose-Capillary 109 (*)    All other components within normal limits  I-STAT CHEM 8, ED - Abnormal; Notable for the following:    Glucose, Bld 100 (*)    Calcium, Ion 1.12 (*)    All  other components within normal limits  BASIC METABOLIC PANEL  CBC  ETHANOL  PROTIME-INR  APTT  CBC  DIFFERENTIAL  COMPREHENSIVE METABOLIC PANEL  URINE RAPID DRUG SCREEN, HOSP PERFORMED  URINALYSIS, ROUTINE W REFLEX MICROSCOPIC (NOT AT [facility])  I-STAT TROPOININ, ED    EKG  EKG Interpretation None       Radiology Ct Head Code Stroke W/o Cm  Result Date: 07/03/2016 CLINICAL DATA:  Code stroke. LEFT arm sided numbness. History of sickle cell trait, atrial fibrillation. EXAM: CT HEAD WITHOUT CONTRAST TECHNIQUE: Contiguous axial images were obtained from the base of the skull through the vertex without intravenous contrast. COMPARISON:  None. FINDINGS: BRAIN: The ventricles and sulci are normal for age. No intraparenchymal hemorrhage, mass effect nor  midline shift. Wedge-like hypodensity RIGHT cerebellum. Old small LEFT cerebellar infarct. Small area LEFT occipital lobe encephalomalacia. Patchy supratentorial white matter hypodensities within normal range for patient's age, though non-specific are most compatible with chronic small vessel ischemic disease. No acute large vascular territory infarcts. No abnormal extra-axial fluid collections. Basal cisterns are patent. VASCULAR: Mild calcific atherosclerosis of the carotid siphons. No dense MCA. SKULL: No skull fracture. No significant scalp soft tissue swelling. SINUSES/ORBITS: The mastoid air-cells and included paranasal sinuses are well-aerated.The included ocular globes and orbital contents are non-suspicious. OTHER: None. ASPECTS [facility] Stroke Program Early CT Score) - Ganglionic level infarction (caudate, lentiform nuclei, internal capsule, insula, M1-M3 cortex): 7 - Supraganglionic infarction (M4-M6 cortex): 3 Total score (0-10 with 10 being normal): 10 IMPRESSION: 1. Age indeterminate Small RIGHT cerebellar infarct. Old LEFT occipital/PCA territory infarct. 2. ASPECTS is 10. Critical Value/emergent results were called by telephone at the  time of interpretation on 07/03/2016 at 2:48 am to Dr. Leonel Ramsay, Neurology, who verbally acknowledged these results. Electronically Signed   By: Elon Alas M.D.   On: 07/03/2016 02:51    Procedures Procedures (including critical care time)  Medications Ordered in ED Medications - No data to display   Initial Impression / Assessment and Plan / ED Course  I have reviewed the triage vital signs and the nursing notes.  Pertinent labs & imaging results that were available during my care of the patient were reviewed by me and considered in my medical decision making (see chart for details).  Clinical Course   Patient presents to the emergency department for evaluation of left arm numbness and tingling. Symptoms began acutely approximately 3 hours before arrival to the ER. She reported that her symptoms began between 11 and 11:30 PM. She was awake, using her computer at the time.  Examination revealed mild ataxia with some mild decreased strength of her left upper extremity. Code Stroke was initiated. Patient was evaluated by Dr. Leonel Ramsay who did not feel that her deficit was severe enough to warrant TPA. Patient will require hospitalization for further workup.  Final Clinical Impressions(s) / ED Diagnoses   Final diagnoses:  Cerebrovascular accident (CVA), unspecified mechanism ([place])    New Prescriptions New Prescriptions   No medications on file   I personally performed the services described in this documentation, which was scribed in my presence. The recorded information has been reviewed and is accurate.      Orpah Greek, MD 07/03/16 [phone]

## 2016-07-03 NOTE — Care Management Obs Status (Signed)
[place] [facility] NOTIFICATION   Patient Details  Name: Melanie Riddle MRN: [id number] Date of Birth: 1951/09/26   Medicare Observation Status Notification Given:  Yes    Apolonio Schneiders, RN 07/03/2016, 6:09 PM

## 2016-07-03 NOTE — Progress Notes (Signed)
Pt relaxing in bed. Dghts at bedside. Pt denying pain, and states numbness in fingers is barely at the tips. No facial droop noted. Equal strength bilaterally. Call bell and phone within reach. Will continue to monitor.

## 2016-07-03 NOTE — H&P (Signed)
[facility] Admission History and Physical Service Pager: [phone]  Patient name: Melanie Riddle Medical record number: [id number] Date of birth: Mar 19, 1951 Age: 65 y.o. Gender: female  Primary Care Provider: Dimas Chyle, MD Consultants: neurology Code Status: FULL per discussion on admission   Chief Complaint: numbness/tingling of LUE   Assessment and Plan: Melanie Riddle is a 65 y.o. female presenting with UE numbness and tingling . PMH is significant for hepatitis C with liver fibrosis and atrial fibrillation   Left sided numbness and weakness: Very subtle weakness on exam, unsure if this is pathologic or because she is right handed. 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 Patient's risk factor currently is a h/o paroxysmal atrial fibrillation, for which she is not on anticoagulation. Patient's ASCVD score with previous lipid panel is 9.7%.   - observe in telemetry for atrial fibrillation, attending Dr. Nori Riddle. - neurology following  - will start ASA  - CTA head/neck ordered - MRI brain  - echo ordered - frequent neuro checks - risk stratification labs- will get A1c and TSH (had lipids in 3/17) - given ASCVD score, will start statin  - PT/OT consult  PAF: sinus bradycardia on my exam on admission.  Initially diagnosed during her colonoscopy in 2015- she went bradycardic during the event then into afib with RVR. Cardiology who saw her the day of the event felt this was vagally mediated. She spontaneously reverted back to SR without intervention. She was supposed to f/u with cardiology in 1 month but never did. She was noted to be irregularly irregular by her PCP in 12/2015 however opted to defer anticoagulation. CHADVASc score was 2, with old infarcts note on CT, it is now 4 making her high risk of stroke in the future.  - monitor on telemetry  - will hold off on heparin gtt given she is within 24hrs of possible stroke, however will need to readdress  anticoagulation with her.  Elevated BPs: SBPs noted to be in the 160-170s. Normotensive in the clinic. Never diagnosed with HTN.  - will continue to monitor - will allow permissive HTN for now.   Hepatitis C with liver fibrosis: sees Dr. Linus Riddle. Elastography with F2/3. Just completed 12 week course of Harvoni with undetectable viral load. - discussed importance of decreasing alcohol consumption - pt to f/u with ID in 6 months for repeat testing.  FEN/GI: Heart healthy diet Prophylaxis: SCDs  Disposition: observation pending TIA/stroke work up   History of Present Illness:  Melanie Riddle is a 65 y.o. female presenting with L UE numbness.   She is a R- handed and presents with L UE numbness and tingling. She notes her symptoms began around 11:30pm while she was sitting at her desk. She note she felt initially that the circulation was poor in that arm, but the symptoms did not resolve after massage so she became worried which prompted her trip to the ED. She denies unilateral weakness,  headache, confusion, dysphagia, slurred speech, vision changes, facial droop, chest pain, or SOB.  She notes she's had this occur before approximately 2 weeks ago, however she was able to massage her arm and the numbness and tingling subsided. She had 1 episode of afib during a colonoscopy but nothing since and she is not currently on treatment for that.   In the ED a code stroke was called. CT of the head revealed small old infarcts. Neurology felt she had subtle weakness in the L UE and L LE and recommended admission for  TIA/Stroke work up. Given her minimum symptoms, it was determined to not give tPA.   Review Of Systems: Per HPI with the following additions  Review of Systems  Constitutional: Negative for chills, fever and malaise/fatigue.  HENT: Negative for hearing loss and tinnitus.   Eyes: Negative for blurred vision, double vision, photophobia and redness.  Respiratory: Negative for shortness of breath.    Cardiovascular: Negative for chest pain, claudication and leg swelling.  Gastrointestinal: Negative for abdominal pain, heartburn and nausea.  Genitourinary: Negative for dysuria.  Musculoskeletal: Negative for back pain, falls and myalgias.  Skin: Negative for rash.  Neurological: Positive for tingling and sensory change. Negative for dizziness, tremors, speech change, focal weakness, seizures, loss of consciousness, weakness and headaches.  Endo/Heme/Allergies: Does not bruise/bleed easily.  Psychiatric/Behavioral: Negative for depression.    Patient Active Problem List   Diagnosis Date Noted  . Cerebral embolism with cerebral infarction 07/03/2016  . Liver fibrosis ([place]) 06/17/2016  . Healthcare maintenance 12/24/2015  . Chronic hepatitis C without hepatic coma ([place]) 08/13/2015  . Decreased visual acuity 08/10/2015  . Atrial fibrillation ([place]) 09/28/2014  . Arthritis of hand 04/07/2012  . Ischemic optic neuropathy of right eye 03/17/2012  . Seasonal allergies 03/17/2012    Past Medical History: Past Medical History:  Diagnosis Date  . Allergy   . Arthritis of hand 04/07/2012  . Atrial fibrillation ([place]) 09/28/2014  . Decreased visual acuity 08/10/2015  . Ischemic optic neuropathy of right eye 03/17/2012   Asked for optometry note.   . Paroxysmal atrial fibrillation ([place]) 06/28/14   single episodes occured during recovery post colonoscopy  . Seasonal allergies 03/17/2012  . Sickle cell trait ([place])   . Unspecified viral hepatitis C without hepatic coma 08/13/2015    Past Surgical History: Past Surgical History:  Procedure Laterality Date  . CESAREAN SECTION    . SALPINGECTOMY Right 2005   Dr Ronnald Ramp  . TUMOR EXCISION Right    benign right thigh, [place]    Social History: Social History  Substance Use Topics  . Smoking status: Former Research scientist (life sciences)  . Smokeless tobacco: Never Used  . Alcohol use 0.0 oz/week     Comment: wine 3-7 per week   Additional social history:  drinks 5 days a week, 1-2 glasses of wine or beer. Never withdrawn. Please also refer to relevant sections of EMR.  Family History: Family History  Problem Relation Age of Onset  . Kidney disease Father   . Sickle cell anemia Brother   . Hyperlipidemia Brother   . Stroke Brother   . Cancer Maternal Aunt     lung cancer  . Colon cancer Neg Hx     Allergies and Medications: Allergies  Allergen Reactions  . Penicillins Swelling   No current facility-administered medications on file prior to encounter.    Current Outpatient Prescriptions on File Prior to Encounter  Medication Sig Dispense Refill  . cholecalciferol (VITAMIN D) 1000 UNITS tablet Take 1 tablet (1,000 Units total) by mouth daily.    . Multiple Minerals-Vitamins (CALCIUM-MAGNESIUM-ZINC-D3 PO) Take 1 tablet by mouth daily.      Objective: BP 149/76   Pulse (!) 58   Temp 97.8 F (36.6 C) (Oral)   Resp 21   Ht 5\' 2"  (1.575 m)   Wt 150 lb (68 kg)   SpO2 99%   BMI 27.44 kg/m  Exam: General: Lying in bed in NAD. Non-toxic. Pleasant.  Eyes: Conjunctivae non-injected. PERRL.  ENTM: Moist mucous membranes. Oropharynx clear. No  nasal discharge.  Neck: Supple, no LAD Cardiovascular: Bradycardic. Regular rhythm. No murmurs, rubs, or gallops noted. No palpable thrill.   Respiratory: No increased WOB. CTAB without wheezing, rhonchi, or crackles noted. Abdomen: +BS, soft, non-distended, non-tender.  MSK: Normal bulk and tone noted. No gross deformities noted. No pitting edema noted. Skin: No rashes noted over exposed skin. Neuro: A&O x4. Speech clear. EOMI, Uvula and tongue midline. Facial movements symmetric. Very mildly decreased strength in the LLE and LUE compared to the R, more noticeable on the LE exam. Notes decreased sensation on the LUE compared to the R. Normal DTRs. Downgoing  Babinski bilaterally. Normal finger to nose, RAM, and heel to shin tests. Gait normal.  Psych:  Appropriate mood and affect.    Labs  and Imaging: CBC BMET   Recent Labs Lab 07/03/16 0229 07/03/16 0249  WBC 9.0  --   HGB 12.7 13.3  HCT 37.5 39.0  PLT 191  --     Recent Labs Lab 07/03/16 0229 07/03/16 0249  NA 137 141  K 4.2 4.1  CL 104 101  CO2 26  --   BUN 14 17  CREATININE 0.68 0.70  GLUCOSE 102* 100*  CALCIUM 9.2  --     I-troponin- 0.01  EKG: sinus bradycardia. HR 57. TWI in aVR and V1 present in 2015.   Ct Head Code Stroke W/o Cm  Result Date: 07/03/2016 CLINICAL DATA:  Code stroke. LEFT arm sided numbness. History of sickle cell trait, atrial fibrillation. EXAM: CT HEAD WITHOUT CONTRAST TECHNIQUE: Contiguous axial images were obtained from the base of the skull through the vertex without intravenous contrast. COMPARISON:  None. FINDINGS: BRAIN: The ventricles and sulci are normal for age. No intraparenchymal hemorrhage, mass effect nor midline shift. Wedge-like hypodensity RIGHT cerebellum. Old small LEFT cerebellar infarct. Small area LEFT occipital lobe encephalomalacia. Patchy supratentorial white matter hypodensities within normal range for patient's age, though non-specific are most compatible with chronic small vessel ischemic disease. No acute large vascular territory infarcts. No abnormal extra-axial fluid collections. Basal cisterns are patent. VASCULAR: Mild calcific atherosclerosis of the carotid siphons. No dense MCA. SKULL: No skull fracture. No significant scalp soft tissue swelling. SINUSES/ORBITS: The mastoid air-cells and included paranasal sinuses are well-aerated.The included ocular globes and orbital contents are non-suspicious. OTHER: None. ASPECTS [facility] Stroke Program Early CT Score) - Ganglionic level infarction (caudate, lentiform nuclei, internal capsule, insula, M1-M3 cortex): 7 - Supraganglionic infarction (M4-M6 cortex): 3 Total score (0-10 with 10 being normal): 10 IMPRESSION: 1. Age indeterminate Small RIGHT cerebellar infarct. Old LEFT occipital/PCA territory infarct. 2.  ASPECTS is 10. Critical Value/emergent results were called by telephone at the time of interpretation on 07/03/2016 at 2:48 am to Dr. Leonel Ramsay, Neurology, who verbally acknowledged these results. Electronically Signed   By: Elon Alas M.D.   On: 07/03/2016 02:51    Archie Patten, MD 07/03/2016, 4:12 AM PGY-3, [facility] pager: [phone], text pages welcome

## 2016-07-03 NOTE — Progress Notes (Signed)
PT Cancellation Note  Patient Details Name: Melanie Riddle MRN: [id number] DOB: 03-Dec-1950   Cancelled Treatment:    Reason Eval/Treat Not Completed: PT screened, no needs identified, will sign off.  Per OT, pt is at baseline functioning and has no acute PT needs. 07/03/2016  Melanie Riddle, [name] [phone]  (pager)   Melanie Riddle, [name] 07/03/2016, 2:06 PM

## 2016-07-03 NOTE — ED Notes (Signed)
Pt not candidate for tPA per Leonel Ramsay, Neuro MD

## 2016-07-03 NOTE — ED Notes (Signed)
Pt returned from CT, ambulated to BR with steady gait, urine sample obtained. Admitting MD at bedside.

## 2016-07-03 NOTE — Progress Notes (Signed)
STROKE TEAM PROGRESS NOTE   HISTORY OF PRESENT ILLNESS (per record) Melanie Riddle is a 65 y.o. female who presents with left-sided weakness that started around 11:30 PM on 07/03/2016. She has not been to sleep. She states that she has been worried about it, but it has been pretty static since onset. Due to continuing concern, she decided to seek medical care. In the ER, a code stroke was called and she does have an NIH stroke scale of 4, but her deficits are very mild and therefore tPA was not administered. She was admitted for further evaluation and treatment.   SUBJECTIVE (INTERVAL HISTORY) Daughter and granddaugter are at bedside. She stated that she had left UE from shoulder to fingertip numbness for 12 hours and now resolved. Denies any heart palpitation but does not know she had two infarcts on MRI. So far MRI showed no acute stroke, TTE pending.    OBJECTIVE Temp:  [97.5 F (36.4 C)-97.8 F (36.6 C)] 97.6 F (36.4 C) (09/28 0819) Pulse Rate:  [52-152] 52 (09/28 0819) Cardiac Rhythm: Sinus bradycardia (09/28 0707) Resp:  [11-21] 15 (09/28 0819) BP: (133-185)/(53-89) 133/53 (09/28 0819) SpO2:  [99 %-100 %] 100 % (09/28 0819) Weight:  [68 kg (150 lb)-69.4 kg (153 lb 1.6 oz)] 69.4 kg (153 lb 1.6 oz) (09/28 0521)  CBC:  Recent Labs Lab 07/03/16 0229 07/03/16 0249 07/03/16 0515  WBC 9.0  --  8.5  NEUTROABS 4.7  --   --   HGB 12.7 13.3 12.4  HCT 37.5 39.0 36.5  MCV 90.6  --  90.8  PLT 191  --  [id number]    Basic Metabolic Panel:  Recent Labs Lab 07/03/16 0229 07/03/16 0249 07/03/16 0515  NA 137 141 138  K 4.2 4.1 4.3  CL 104 101 105  CO2 26  --  28  GLUCOSE 102* 100* 98  BUN 14 17 13   CREATININE 0.68 0.70 0.66  CALCIUM 9.2  --  9.1    Lipid Panel:    Component Value Date/Time   CHOL 146 07/03/2016 0515   TRIG 83 07/03/2016 0515   HDL 45 07/03/2016 0515   CHOLHDL 3.2 07/03/2016 0515   VLDL 17 07/03/2016 0515   LDLCALC 84 07/03/2016 0515   HgbA1c: No results found  for: HGBA1C Urine Drug Screen:    Component Value Date/Time   LABOPIA NONE DETECTED 07/03/2016 0432   COCAINSCRNUR NONE DETECTED 07/03/2016 0432   LABBENZ NONE DETECTED 07/03/2016 0432   AMPHETMU NONE DETECTED 07/03/2016 0432   THCU NONE DETECTED 07/03/2016 0432   LABBARB NONE DETECTED 07/03/2016 0432      IMAGING I have personally reviewed the radiological images below and agree with the radiology interpretations.  Ct Head Code Stroke W/o Cm 07/03/2016 1. Age indeterminate Small RIGHT cerebellar infarct. Old LEFT occipital/PCA territory infarct. 2. ASPECTS is 10. Critical Value/emergent results were called by telephone at the time of interpretation on 07/03/2016 at 2:48 am to Dr. Leonel Ramsay, Neurology, who verbally acknowledged these results.   CTA HEAD 07/03/2016 No emergent large vessel. Severe stenosis LEFT P3 segment.  Moderate stenosis LEFT M1 segment.    CTA NECK 07/03/2016 No hemodynamically significant stenosis or acute vascular process. Focal suspected fibromuscular dysplasia distal RIGHT cervical internal carotid artery.   Mr Brain Wo Contrast 07/03/2016 1. No acute intracranial abnormality. 2. Findings of chronic microvascular disease with old posterior circulation infarcts.   2D Echocardiogram  - Left ventricle: The cavity size was normal. Wall thickness wasnormal. Systolic function was  normal. The estimated ejectionfraction was in the range of 60% to 65%. Wall motion was normal;there were no regional wall motion abnormalities. - Mitral valve: There was mild regurgitation. - Pulmonary arteries: Systolic pressure was mildly increased. PApeak pressure: 38 mm Hg (S).   PHYSICAL EXAM  Temp:  [97.5 F (36.4 C)-97.8 F (36.6 C)] 97.6 F (36.4 C) (09/28 0819) Pulse Rate:  [52-152] 52 (09/28 0819) Resp:  [11-21] 15 (09/28 0819) BP: (133-185)/(53-89) 133/53 (09/28 0819) SpO2:  [99 %-100 %] 100 % (09/28 0819) Weight:  [150 lb (68 kg)-153 lb 1.6 oz (69.4 kg)] 153 lb  1.6 oz (69.4 kg) (09/28 0521)  General - Well nourished, well developed, in no apparent distress.  Ophthalmologic - Sharp disc margins OU.   Cardiovascular - Regular rate and rhythm.  Mental Status -  Level of arousal and orientation to time, place, and person were intact. Language including expression, naming, repetition, comprehension was assessed and found intact. Fund of Knowledge was assessed and was intact.  Cranial Nerves II - XII - II - Visual field intact OU. III, IV, VI - Extraocular movements intact. V - Facial sensation intact bilaterally. VII - Facial movement intact bilaterally. VIII - Hearing & vestibular intact bilaterally. X - Palate elevates symmetrically. XI - Chin turning & shoulder shrug intact bilaterally. XII - Tongue protrusion intact.  Motor Strength - The patient's strength was normal in all extremities and pronator drift was absent.  Bulk was normal and fasciculations were absent.   Motor Tone - Muscle tone was assessed at the neck and appendages and was normal.  Reflexes - The patient's reflexes were 1+ in all extremities and she had no pathological reflexes.  Sensory - Light touch, temperature/pinprick, vibration and proprioception, and Romberg testing were assessed and were symmetrical.    Coordination - The patient had normal movements in the hands and feet with no ataxia or dysmetria.  Tremor was absent.  Gait and Station - The patient's transfers, posture, gait, station, and turns were observed as normal.   ASSESSMENT/PLAN Ms. Melanie Riddle is a 65 y.o. female with history of hepatitis C with liver fibrosis and atrial fibrillation presenting with L sided weakness. She did not receive IV t-PA due to mild symptoms.   TIA - right brain TIA, etiology unclear. Pt no significant risk factors. Concerning for pAfib, given silent strokes and hx of postprocedural afib. Need 30 day cardiac event monitoring.  MRI  No acute stroke, but remote right cerebellar  and left PCA infarcts  CTA Head L P3 severe stenoisis. L M1 moderate stenosis  CTA neck no hemodynamically significant stenosis  2D Echo  EF 60-65%. No source of embolus   Recommend 30 day cardiac event monitoring as outpt to rule out afib  LDL 84  HgbA1c pending  SCDs for VTE prophylaxis  Diet NPO time specified  No antithrombotic prior to admission, now on aspirin 325 mg daily. Continue ASA on discharge.  Patient counseled to be compliant with her antithrombotic medications  Ongoing aggressive stroke risk factor management  Therapy recommendations:  pending  Disposition:  pending  Atrial Fibrillation - transient postprocedural vs. pAfib  Home anticoagulation:  none   Noncompliant with followup, has refused AC in th past  Need 30 day cardiac event monitoring as out pt   Elevated BP  130-150s  No hx HTN  Close home monitoring  Hyperlipidemia  Home meds:  No statin  LDL 84, goal < 70  Now on lipitor 20 mg daily  Continue lipitor on discharge.  Other Stroke Risk Factors  Advanced age  Former Cigarette smoker  ETOH use, advised to drink no more than 1 drink(s) a day  Family hx stroke (brother)  Other Active Problems  Hepatitis C with liver fibrosis - recent check hepC viral load was 0  Hospital day # 0  Neurology will sign off. Please call with questions. Pt will follow up with Cecille Rubin NP at [facility] in about 6 weeks. Thanks for the consult.  Rosalin Hawking, MD PhD Stroke Neurology 07/03/2016 5:22 PM     To contact Stroke Continuity provider, please refer to [email or web address] After hours, contact General Neurology

## 2016-07-03 NOTE — Consult Note (Addendum)
Neurology Consultation Reason for Consult: Left-sided weakness Referring Physician: Pollina, C  CC: Left-sided weakness  History is obtained from: Patient  HPI: Melanie Riddle is a 65 y.o. female who presents with left-sided weakness that started around 11:30 PM. She has not been to sleep. She states that she has been worried about it, but it has been pretty static since onset. Due to continuing concern, she decided to seek medical care. In the ER, a code stroke was called and she does have an NIH stroke scale of 4, but her deficits are very mild and therefore tPA was not administered.   LKW: 11:30 PM 9/27 tpa given?: no, mild symptoms    ROS: A 14 point ROS was performed and is negative except as noted in the HPI.  Past Medical History:  Diagnosis Date  . Allergy   . Arthritis of hand 04/07/2012  . Atrial fibrillation ([place]) 09/28/2014  . Decreased visual acuity 08/10/2015  . Ischemic optic neuropathy of right eye 03/17/2012   Asked for optometry note.   . Paroxysmal atrial fibrillation ([place]) 06/28/14   single episodes occured during recovery post colonoscopy  . Seasonal allergies 03/17/2012  . Sickle cell trait ([place])   . Unspecified viral hepatitis C without hepatic coma 08/13/2015     Family History  Problem Relation Age of Onset  . Kidney disease Father   . Sickle cell anemia Brother   . Hyperlipidemia Brother   . Stroke Brother   . Cancer Maternal Aunt     lung cancer  . Colon cancer Neg Hx      Social History:  reports that she has quit smoking. She has never used smokeless tobacco. She reports that she drinks alcohol. She reports that she does not use drugs.   Exam: Current vital signs: BP 138/89   Pulse (!) 55   Temp 97.8 F (36.6 C) (Oral)   Resp 17   Ht 5\' 2"  (1.575 m)   Wt 68 kg (150 lb)   SpO2 99%   BMI 27.44 kg/m  Vital signs in last 24 hours: Temp:  [97.8 F (36.6 C)] 97.8 F (36.6 C) (09/28 0209) Pulse Rate:  [55-60] 55 (09/28 0315) Resp:  [14-17]  17 (09/28 0315) BP: (138-185)/(80-89) 138/89 (09/28 0315) SpO2:  [99 %-100 %] 99 % (09/28 0315) Weight:  [68 kg (150 lb)] 68 kg (150 lb) (09/28 0209)   Physical Exam  Constitutional: Appears well-developed and well-nourished.  Psych: Affect appropriate to situation Eyes: No scleral injection HENT: No OP obstrucion Head: Normocephalic.  Cardiovascular: Normal rate and regular rhythm.  Respiratory: Effort normal and breath sounds normal to anterior ascultation GI: Soft.  No distension. There is no tenderness.  Skin: WDI  Neuro: Mental Status: Patient is awake, alert, oriented to person, place, month, year, and situation. Patient is able to give a clear and coherent history. No signs of aphasia or neglect Cranial Nerves: II: Visual Fields are full. Pupils are equal, round, and reactive to light. III,IV, VI: EOMI without ptosis or diploplia.  V: Facial sensation is symmetric to temperature VII: She has a mildly decreased nasolabial fold on the left VIII: hearing is intact to voice X: Uvula elevates symmetrically XI: Shoulder shrug is symmetric. XII: tongue is midline without atrophy or fasciculations.  Motor: Tone is normal. Bulk is normal. She has very mild weakness of the left arm and left leg. Sensory: Sensation is diminished in the left arm Cerebellar: FNF and HKS are intact bilaterally  I have reviewed labs in epic and the results pertinent to this consultation are: CMP-unremarkable  I have reviewed the images obtained: CT head-2 previous posterior circulation strokes  Impression: 65 year old female with mild weakness and what appears to be previous subclinical strokes. She has had a single episode of atrial fibrillation in the past, and it is possible that she actually has works is no A. fib as an etiology. Due to the symptoms being very mild, I discussed with her that I did not feel that the benefits of TPA would justify the risks involved. I would use aspirin  for now, once MRI results are available may need to consider anticoagulation given history of A. fib.  Recommendations: 1. HgbA1c, fasting lipid panel 2. MRI of the brain without contrast 3. Frequent neuro checks 4. Echocardiogram 5. CT angiogram of the head and neck 6. Prophylactic therapy-Antiplatelet med: Aspirin - dose 325mg  PO or 300mg  PR 7. Risk factor modification 8. Telemetry monitoring 9. PT consult, OT consult, Speech consult 10. please page stroke NP  Or  PA  Or MD  from 8am -4 pm starting 9/28 as this patient will be followed by the stroke team at this point.   You can look them up on [email or web address]      Roland Rack, MD [facility] [phone]  If 7pm- 7am, please page neurology on call as listed in [place].

## 2016-07-03 NOTE — Evaluation (Signed)
Occupational Therapy Evaluation Patient Details Name: Melanie Riddle MRN: [id number] DOB: August 29, 1951 Today's Date: 07/03/2016    History of Present Illness Melanie Riddle is a 65 y.o. female presenting with UE numbness and tingling . PMH is significant for hepatitis C with liver fibrosis and atrial fibrillation.   Clinical Impression   Pt admitted with the above diagnoses. Pt independent with ADLs PTA and is at/near baseline with ADLs on evaluation. Session details below. Pt reporting some residual mild numbness/tingling in fingertips of left hand. No further acute OT needs indicated. OT signing off.     Follow Up Recommendations  No OT follow up    Equipment Recommendations  None recommended by OT    Recommendations for Other Services       Precautions / Restrictions Precautions Precautions: None Restrictions Weight Bearing Restrictions: No      Mobility Bed Mobility Overal bed mobility: Independent                Transfers Overall transfer level: Independent Equipment used: None             General transfer comment: from EOB and regular height toilet    Balance Overall balance assessment: No apparent balance deficits (not formally assessed)                                          ADL Overall ADL's : Independent                                       General ADL Comments: Pt completed toilet transfer, simulated tub transfer, community distance functional mobility and dynamic standing activites with good toleration and no LOB.      Vision     Perception     Praxis      Pertinent Vitals/Pain Pain Assessment: No/denies pain     Hand Dominance Right   Extremity/Trunk Assessment Upper Extremity Assessment Upper Extremity Assessment: Overall WFL for tasks assessed;LUE deficits/detail LUE Deficits / Details: mild tingling/numbness reported in fingertips. "My arm feels so much better now." Able to twist lid on/off  mouthwash container with slight extra time/effort needed. Discussed [place] activities to facilitate full return to baseline of L hand.   Lower Extremity Assessment Lower Extremity Assessment: Overall WFL for tasks assessed       Communication Communication Communication: No difficulties   Cognition Arousal/Alertness: Awake/alert Behavior During Therapy: WFL for tasks assessed/performed Overall Cognitive Status: Within Functional Limits for tasks assessed                     General Comments       Exercises       Shoulder Instructions      Home Living Family/patient expects to be discharged to:: Private residence Living Arrangements: Alone Available Help at Discharge: Family;Other (Comment) (daughter) Type of Home: Other(Comment) ("senior housing") Home Access: Level entry     [place]: One level     Bathroom Shower/Tub: Tub/shower unit         [place]: Grab bars - tub/shower          Prior Functioning/Environment Level of Independence: Independent        Comments: active, exercises, semi-retired        OT Problem List:     OT  Treatment/Interventions:      OT Goals(Current goals can be found in the care plan section)    OT Frequency:     Barriers to D/C:            Co-evaluation              End of Session    Activity Tolerance: Patient tolerated treatment well Patient left: in bed   Time: 1345-1405 OT Time Calculation (min): 20 min Charges:  OT General Charges $OT Visit: 1 Procedure OT Evaluation $OT Eval Low Complexity: 1 Procedure G-Codes: OT G-codes **NOT FOR INPATIENT CLASS** Functional Assessment Tool Used: clinical judgement Functional Limitation: Self care Self Care Current Status [id number]): 0 percent impaired, limited or restricted Self Care Discharge Status [id number]): 0 percent impaired, limited or restricted  Hortencia Pilar 07/03/2016, 2:14 PM

## 2016-07-03 NOTE — ED Notes (Signed)
Family medicine MD at bedside.

## 2016-07-03 NOTE — ED Notes (Signed)
CBG is 109.

## 2016-07-04 ENCOUNTER — Observation Stay (HOSPITAL_BASED_OUTPATIENT_CLINIC_OR_DEPARTMENT_OTHER): Payer: Medicare Other

## 2016-07-04 DIAGNOSIS — Z8673 Personal history of transient ischemic attack (TIA), and cerebral infarction without residual deficits: Secondary | ICD-10-CM | POA: Diagnosis not present

## 2016-07-04 DIAGNOSIS — G459 Transient cerebral ischemic attack, unspecified: Secondary | ICD-10-CM | POA: Diagnosis not present

## 2016-07-04 DIAGNOSIS — E785 Hyperlipidemia, unspecified: Secondary | ICD-10-CM

## 2016-07-04 DIAGNOSIS — R200 Anesthesia of skin: Secondary | ICD-10-CM | POA: Diagnosis not present

## 2016-07-04 DIAGNOSIS — I4891 Unspecified atrial fibrillation: Secondary | ICD-10-CM

## 2016-07-04 DIAGNOSIS — G451 Carotid artery syndrome (hemispheric): Secondary | ICD-10-CM | POA: Diagnosis not present

## 2016-07-04 DIAGNOSIS — I480 Paroxysmal atrial fibrillation: Secondary | ICD-10-CM

## 2016-07-04 LAB — ECHOCARDIOGRAM COMPLETE
HEIGHTINCHES: 62 in
WEIGHTICAEL: 2449.6 [oz_av]

## 2016-07-04 LAB — CBC
HCT: 36 % (ref 36.0–46.0)
Hemoglobin: 12 g/dL (ref 12.0–15.0)
MCH: 30.3 pg (ref 26.0–34.0)
MCHC: 33.3 g/dL (ref 30.0–36.0)
MCV: 90.9 fL (ref 78.0–100.0)
PLATELETS: 160 10*3/uL (ref 150–400)
RBC: 3.96 MIL/uL (ref 3.87–5.11)
RDW: 12.5 % (ref 11.5–15.5)
WBC: 6.2 10*3/uL (ref 4.0–10.5)

## 2016-07-04 LAB — BASIC METABOLIC PANEL
Anion gap: 5 (ref 5–15)
BUN: 7 mg/dL (ref 6–20)
CALCIUM: 9.1 mg/dL (ref 8.9–10.3)
CO2: 27 mmol/L (ref 22–32)
CREATININE: 0.63 mg/dL (ref 0.44–1.00)
Chloride: 109 mmol/L (ref 101–111)
GFR calc non Af Amer: >60 mL/min (ref >60)
Glucose, Bld: 94 mg/dL (ref 65–99)
Potassium: 4.2 mmol/L (ref 3.5–5.1)
SODIUM: 141 mmol/L (ref 135–145)

## 2016-07-04 MED ORDER — ASPIRIN 325 MG PO TABS: 325.0000 mg | ORAL_TABLET | Freq: Every day | ORAL | 0 refills | Status: DC

## 2016-07-04 MED ORDER — ATORVASTATIN CALCIUM 40 MG PO TABS: 40.0000 mg | ORAL_TABLET | Freq: Every day | ORAL | 0 refills | Status: DC

## 2016-07-04 MED FILL — ATORVASTATIN 40 MG TABLET: 40 | 30 days supply | Qty: 30 | Fill #0

## 2016-07-04 NOTE — Consult Note (Signed)
   [facility] CM Inpatient Consult   07/04/2016  [name] [birth year] [id number]    Patient evaluated for community based chronic disease management services with [facility] as a benefit of patient's Medicare Insurance. Melanie Riddle is a 65 y.o. female presenting with UE numbness and tingling . PMH is significant for hepatitis C with liver fibrosis and atrial fibrillation  Spoke with patient at bedside to explain [facility] services.  Patient will receive post hospital discharge call and will be evaluated for monthly home visits for assessments and disease process education.  Left contact information and [facility] literature at bedside. Consent form signed.  Made Inpatient Case Manager aware that [facility] following. Of note, [facility] Care Management services does not replace or interfere with any services that are arranged by inpatient case management or social work.  For additional questions or referrals please contact:    Natividad Brood, RN BSN [facility] Liaison  [phone] business mobile phone Toll free office [phone]

## 2016-07-04 NOTE — Progress Notes (Signed)
PRELIMINARY RESULTS* Echocardiogram 2D Echocardiogram has been performed.  Melanie Riddle M 07/04/2016, 11:54 AM

## 2016-07-04 NOTE — Discharge Instructions (Signed)
Ischemic Stroke Treated Without Warfarin °An ischemic stroke (cerebrovascular accident) is the sudden death of brain tissue. It is a medical emergency. An ischemic stroke can cause permanent loss of brain function. This can cause problems with different parts of your body. °CAUSES °An ischemic stroke is caused by a decrease of oxygen supply to an area of your brain. It is usually the result of a small blood clot (embolus) or collection of cholesterol or fat (plaque) that blocks blood flow in the brain. An ischemic stroke can also be caused by blocked or damaged carotid arteries. °RISK FACTORS °· High blood pressure (hypertension). °· High cholesterol. °· Diabetes mellitus. °· Heart disease. °· The buildup of plaque in the blood vessels (peripheral artery disease or atherosclerosis). °· The buildup of plaque in the blood vessels that provide blood and oxygen to the brain (carotid artery stenosis). °· An abnormal heart rhythm (atrial fibrillation). °· Obesity. °· Smoking cigarettes. °· Taking oral contraceptives, especially in combination with using tobacco. °· Physical inactivity. °· A diet that is high in fats, salt (sodium), and calories. °· Excessive alcohol use. °· Use of illegal drugs, especially cocaine and methamphetamine. °· Being African American. °· Being over the age of 55 years. °· Family history of stroke. °· Previous history of blood clots, stroke, TIA (transient ischemic attack), or heart attack. °· Sickle cell disease. °SIGNS AND SYMPTOMS °These symptoms usually develop suddenly, or you may notice them after waking up from sleep. Symptoms may include sudden: °· Weakness or numbness in your face, arm, or leg, especially on one side of your body. °· Confusion. °· Trouble speaking (aphasia) or understanding speech. °· Trouble seeing with one or both eyes. °· Trouble walking or difficulty moving your arms or legs. °· Dizziness. °· Loss of balance or coordination. °· Severe headache with no known cause.  The headache is often described as the worst headache ever experienced. °DIAGNOSIS °Your health care provider can often determine the presence or absence of an ischemic stroke based on your symptoms, history, and physical exam. CT (computed tomography) of the brain is usually performed to confirm the stroke, determine causes, and determine stroke severity. Other tests may be done to find the cause of the stroke. These tests may include: °· ECG (electrocardiogram). °· Continuous heart monitoring. °· Echocardiogram. °· Carotid ultrasound. °· MRI. °· A scan of the brain circulation. °· Blood tests. °TREATMENT °It is very important to seek treatment at the first sign of stroke symptoms. Your health care provider may perform the following treatments within 6 hours of the onset of stroke symptoms: °· Medicine to dissolve the blood clot (thrombolytic). °· Inserting a device into the affected artery to remove the blood clot. °These treatments may not be effective if too much time has passed since your stroke symptoms began. Even if you do not know when your symptoms began, get treatment as soon as possible. There are other treatment options that may be given, such as: °· Oxygen. °· IV fluids. °· Medicines to thin the blood (anticoagulants). °· A procedure to widen blocked arteries. °Your treatment will depend on how long you have had your symptoms, the severity of your symptoms, and the cause of your symptoms. °Your health care provider will take measures to prevent short-term and long-term complications of stroke, such as: °· Breathing foreign material into the lungs (aspiration pneumonia). °· Blood clots in the legs. °· Bedsores. °· Falls. °Medicines and dietary changes may be used to help treat and manage risk factors for   stroke, such as diabetes and high blood pressure. °If any of your body's functions were impaired by stroke, you may work with physical, speech, or occupational therapists to help you recover. °HOME CARE  INSTRUCTIONS °· Take medicines only as directed by your health care provider. Follow the directions carefully. Medicines may be used to control risk factors for a stroke. Be sure that you understand all your medicine instructions. °· If swallow studies have determined that your swallowing reflex is present, you should eat healthy foods. Foods may need to be a soft or pureed consistency, or you may need to take small bites in order to avoid aspirating or choking. °· Follow physical activity guidelines as directed by your health care team. °· Do not use any tobacco products, including cigarettes, chewing tobacco, or electronic cigarettes. If you smoke, quit. If you need help quitting, ask your health care provider. °· Limit or stop alcohol use. °· A safe home environment is important to reduce the risk of falls. Your health care provider may arrange for specialists to evaluate your home. Having grab bars in the bedroom and bathroom is often important. Your health care provider may arrange for equipment to be used at home, such as raised toilets and a seat for the shower. °· Ongoing physical, occupational, and speech therapy may be needed to maximize your recovery after a stroke. If you have been advised to use a walker or a cane, use it at all times. Be sure to keep your therapy appointments. °· Keep all follow-up visits with your health care provider. This is very important. This includes any referrals, therapy, rehabilitation, and lab tests. Proper follow-up can prevent another stroke from occurring. °PREVENTION °The risk of a stroke can be decreased by appropriately treating high blood pressure, high cholesterol, diabetes, heart disease, and obesity. It can also be decreased by quitting smoking, limiting alcohol, and staying physically active. °SEEK IMMEDIATE MEDICAL CARE IF: °· You have sudden weakness or numbness in your face, arm, or leg, especially on one side of your body. °· You have sudden confusion. °· You  have sudden trouble speaking (aphasia) or understanding. °· You have sudden trouble seeing with one or both eyes. °· You have sudden trouble walking or difficulty moving your arms or legs. °· You have sudden dizziness. °· You have a sudden loss of balance or coordination. °· You have a sudden, severe headache with no known cause. °· You have a partial or total loss of consciousness. °Any of these symptoms may represent a serious problem that is an emergency. Do not wait to see if the symptoms will go away. Get medical help right away. Call your local emergency services (911 in [place] Do not drive yourself to the hospital. °  °This information is not intended to replace advice given to you by your health care provider. Make sure you discuss any questions you have with your health care provider. °  °Document Released: 07/07/2014 Document Reviewed: 07/07/2014 °Elsevier Interactive Patient Education ©2016 [employer] ° °

## 2016-07-04 NOTE — Progress Notes (Signed)
Family Medicine Teaching Service Daily Progress Note Intern Pager: [phone]  Patient name: Melanie Riddle Medical record number: [id number] Date of birth: 1951-02-28 Age: 65 y.o. Gender: female  Primary Care Provider: Dimas Chyle, MD Consultants: neurology Code Status: Full  Pt Overview and Major Events to Date:  9/28- Admitted to telemetry for stroke rule out  Assessment and Plan: Melanie Riddle is a 65 y.o. female presenting with UE numbness and tingling . PMH is significant for hepatitis C with liver fibrosis and atrial fibrillation   Left sided numbness and weakness: Very subtle weakness on exam, unsure if this is pathologic or because she is right handed. 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 Patient's risk factor currently is a h/o paroxysmal atrial fibrillation, for which she is not on anticoagulation. Patient's ASCVD score with previous lipid panel is 9.7%, with current lipid panel it is 11.3%.   - continue to observe in telemetry for atrial fibrillation - neurology following, appreciate recommendations - continue ASA - MRI brain negative for acute stroke, with evidence of prior small events - echo pending - frequent neuro checks  - risk stratification labs- A1c pending, can follow as outpatient - continue statin Lipitor 40mg - patient has voiced she does not want to take this after leaving hospital - PT consult pending - OT consult no follow up   PAF: sinus bradycardia on my exam on admission.  Initially diagnosed during her colonoscopy in 2015- she went bradycardic during the event then into afib with RVR. Cardiology who saw her the day of the event felt this was vagally mediated. She spontaneously reverted back to SR without intervention. She was supposed to f/u with cardiology in 1 month but never did. She was noted to be irregularly irregular by her PCP in 12/2015 however opted to defer anticoagulation. CHADVASc score was 2, with old infarcts note on CT, it is now 4 making  her high risk of stroke in the future.  - monitor on telemetry  - will need to address anticoagulation with her, pharmacy to discuss options with her today but patient has indicated she is hesitant to start any medication and would prefer to discuss it outpatient  Elevated BPs: SBPs noted to be in the 130s-150s. Never diagnosed with HTN.  - will continue to monitor - will allow permissive HTN for now - patient hesitant to start blood pressure medication, feels BP is due to stress of being in hospital and would prefer to follow up as outpatient   Hepatitis C with liver fibrosis: sees Dr. Linus Riddle. Elastography with F2/3. Just completed 12 week course of Harvoni with undetectable viral load. - discussed importance of decreasing alcohol consumption - pt to f/u with ID in 6 months for repeat testing.  FEN/GI: Heart healthy diet Prophylaxis: SCDs  Disposition: home pending echo completion  Subjective:  Melanie Riddle is doing well, slept well last night. Denies further numbness/tingling, no chest pain or shortness of breath. Does not want to take medications daily after leaving the hospital, she prefers lifestyle interventions  Objective: Temp:  [97.6 F (36.4 C)-97.9 F (36.6 C)] 97.8 F (36.6 C) (09/29 0605) Pulse Rate:  [51-58] 51 (09/29 0605) Resp:  [15-20] 20 (09/29 0605) BP: (129-157)/(53-68) 129/68 (09/29 0605) SpO2:  [98 %-100 %] 98 % (09/29 [id number]) Physical Exam: General: pleasant lady laying in bed in no acute distress Cardiovascular: RRR no murmurs rubs or gallops Respiratory: CTAB no increased work of breathing Abdomen: soft nontender non-distended, +BS Extremities: no edema or cyanosis  Laboratory:  Recent Labs Lab 07/03/16 0229 07/03/16 0249 07/03/16 0515 07/04/16 0531  WBC 9.0  --  8.5 6.2  HGB 12.7 13.3 12.4 12.0  HCT 37.5 39.0 36.5 36.0  PLT 191  --  173 160    Recent Labs Lab 07/03/16 0229 07/03/16 0249 07/03/16 0515 07/04/16 0531  NA 137 141 138 141  K  4.2 4.1 4.3 4.2  CL 104 101 105 109  CO2 26  --  28 27  BUN 14 17 13 7   CREATININE 0.68 0.70 0.66 0.63  CALCIUM 9.2  --  9.1 9.1  PROT 7.1  --   --   --   BILITOT 0.8  --   --   --   ALKPHOS 83  --   --   --   ALT 16  --   --   --   AST 23  --   --   --   GLUCOSE 102* 100* 98 94   TSH 0.919  Imaging/Diagnostic Tests: Ct Angio Head W Or Wo Contrast  Result Date: 07/03/2016 CLINICAL DATA:  Follow-up stroke.  LEFT sided weakness. EXAM: CT ANGIOGRAPHY HEAD AND NECK TECHNIQUE: Multidetector CT imaging of the head and neck was performed using the standard protocol during bolus administration of intravenous contrast. Multiplanar CT image reconstructions and MIPs were obtained to evaluate the vascular anatomy. Carotid stenosis measurements (when applicable) are obtained utilizing NASCET criteria, using the distal internal carotid diameter as the denominator. CONTRAST:  50 cc Isovue 370 COMPARISON:  CT HEAD July 03, 2016 at 0227 hours FINDINGS: CTA NECK AORTIC ARCH: Normal appearance of the thoracic arch, normal branch pattern. Trace calcific atherosclerosis. The origins of the innominate, left Common carotid artery and subclavian artery are widely patent. RIGHT CAROTID SYSTEM: Common carotid artery is widely patent, coursing in a straight line fashion. Normal appearance of the carotid bifurcation without hemodynamically significant stenosis by NASCET criteria. Normal appearance of the included internal carotid artery. Mild focal beaded appearance of distal RIGHT cervical internal carotid artery. LEFT CAROTID SYSTEM: Common carotid artery is widely patent, coursing in a straight line fashion. Normal appearance of the carotid bifurcation without hemodynamically significant stenosis by NASCET criteria. Normal appearance of the included internal carotid artery. VERTEBRAL ARTERIES:Left vertebral artery is dominant. Normal appearance of the vertebral arteries, which appear widely patent. SKELETON: No acute  osseous process though bone windows have not been submitted. Straightened cervical lordosis. Moderate to severe degenerative discs severe C4-5, C5-6 neural foraminal narrowing. OTHER NECK: Soft tissues of the neck are non-acute though, not tailored for evaluation. Mild fibronodular scarring lung apices. CTA HEAD ANTERIOR CIRCULATION: Normal appearance of the cervical internal carotid arteries, petrous, cavernous and supra clinoid internal carotid arteries. Widely patent anterior communicating artery. Patent anterior and middle cerebral arteries. Moderate stenosis distal LEFT M1 segment. Mild luminal irregularity compatible with atherosclerosis. No large vessel occlusion, hemodynamically significant stenosis, dissection, contrast extravasation or aneurysm. POSTERIOR CIRCULATION: Patent vertebral arteries, vertebrobasilar junction and basilar artery, as well as main branch vessels. Mild stenosis proximal basilar artery. Patent posterior cerebral arteries. Severe stenosis LEFT P3 segment. Mild luminal regularity. No large vessel occlusion, hemodynamically significant stenosis, dissection, contrast extravasation or aneurysm. VENOUS SINUSES: Major dural venous sinuses are patent though not tailored for evaluation on this angiographic examination. ANATOMIC VARIANTS: None. DELAYED PHASE: No abnormal intracranial enhancement. IMPRESSION: CTA NECK: No hemodynamically significant stenosis or acute vascular process. Focal suspected fibromuscular dysplasia distal RIGHT cervical internal carotid artery. CTA HEAD: No emergent large vessel. Severe stenosis  LEFT P3 segment.  Moderate stenosis LEFT M1 segment. Electronically Signed   By: Elon Alas M.D.   On: 07/03/2016 04:46   Ct Angio Neck W Or Wo Contrast  Result Date: 07/03/2016 CLINICAL DATA:  Follow-up stroke.  LEFT sided weakness. EXAM: CT ANGIOGRAPHY HEAD AND NECK TECHNIQUE: Multidetector CT imaging of the head and neck was performed using the standard protocol  during bolus administration of intravenous contrast. Multiplanar CT image reconstructions and MIPs were obtained to evaluate the vascular anatomy. Carotid stenosis measurements (when applicable) are obtained utilizing NASCET criteria, using the distal internal carotid diameter as the denominator. CONTRAST:  50 cc Isovue 370 COMPARISON:  CT HEAD July 03, 2016 at 0227 hours FINDINGS: CTA NECK AORTIC ARCH: Normal appearance of the thoracic arch, normal branch pattern. Trace calcific atherosclerosis. The origins of the innominate, left Common carotid artery and subclavian artery are widely patent. RIGHT CAROTID SYSTEM: Common carotid artery is widely patent, coursing in a straight line fashion. Normal appearance of the carotid bifurcation without hemodynamically significant stenosis by NASCET criteria. Normal appearance of the included internal carotid artery. Mild focal beaded appearance of distal RIGHT cervical internal carotid artery. LEFT CAROTID SYSTEM: Common carotid artery is widely patent, coursing in a straight line fashion. Normal appearance of the carotid bifurcation without hemodynamically significant stenosis by NASCET criteria. Normal appearance of the included internal carotid artery. VERTEBRAL ARTERIES:Left vertebral artery is dominant. Normal appearance of the vertebral arteries, which appear widely patent. SKELETON: No acute osseous process though bone windows have not been submitted. Straightened cervical lordosis. Moderate to severe degenerative discs severe C4-5, C5-6 neural foraminal narrowing. OTHER NECK: Soft tissues of the neck are non-acute though, not tailored for evaluation. Mild fibronodular scarring lung apices. CTA HEAD ANTERIOR CIRCULATION: Normal appearance of the cervical internal carotid arteries, petrous, cavernous and supra clinoid internal carotid arteries. Widely patent anterior communicating artery. Patent anterior and middle cerebral arteries. Moderate stenosis distal LEFT  M1 segment. Mild luminal irregularity compatible with atherosclerosis. No large vessel occlusion, hemodynamically significant stenosis, dissection, contrast extravasation or aneurysm. POSTERIOR CIRCULATION: Patent vertebral arteries, vertebrobasilar junction and basilar artery, as well as main branch vessels. Mild stenosis proximal basilar artery. Patent posterior cerebral arteries. Severe stenosis LEFT P3 segment. Mild luminal regularity. No large vessel occlusion, hemodynamically significant stenosis, dissection, contrast extravasation or aneurysm. VENOUS SINUSES: Major dural venous sinuses are patent though not tailored for evaluation on this angiographic examination. ANATOMIC VARIANTS: None. DELAYED PHASE: No abnormal intracranial enhancement. IMPRESSION: CTA NECK: No hemodynamically significant stenosis or acute vascular process. Focal suspected fibromuscular dysplasia distal RIGHT cervical internal carotid artery. CTA HEAD: No emergent large vessel. Severe stenosis LEFT P3 segment.  Moderate stenosis LEFT M1 segment. Electronically Signed   By: Elon Alas M.D.   On: 07/03/2016 04:46   Mr Brain Wo Contrast  Result Date: 07/03/2016 CLINICAL DATA:  Upper extremity weakness EXAM: MRI HEAD WITHOUT CONTRAST TECHNIQUE: Multiplanar, multiecho pulse sequences of the brain and surrounding structures were obtained without intravenous contrast. COMPARISON:  Head CT 07/03/2016 FINDINGS: Brain: No acute infarct or intraparenchymal hemorrhage. The midline structures are normal. There is periventricular and subcortical white matter hyperintensity compatible with chronic microvascular disease. There is an old right cerebellar infarct and left occipital encephalomalacia. No mass lesion or midline shift. No hydrocephalus or extra-axial fluid collection. Vascular: Major intracranial flow voids are preserved. Evidence of old hemorrhage in the left occipital lobe and chronic single microhemorrhage in the superior right  parietal lobe. Skull and upper cervical spine: The  visualized skull base, calvarium, upper cervical spine and extracranial soft tissues are normal. Sinuses/Orbits: No fluid levels or advanced mucosal thickening. No mastoid effusion. Normal orbits. IMPRESSION: 1. No acute intracranial abnormality. 2. Findings of chronic microvascular disease with old posterior circulation infarcts. Electronically Signed   By: Ulyses Jarred M.D.   On: 07/03/2016 13:34   Ct Head Code Stroke W/o Cm  Result Date: 07/03/2016 CLINICAL DATA:  Code stroke. LEFT arm sided numbness. History of sickle cell trait, atrial fibrillation. EXAM: CT HEAD WITHOUT CONTRAST TECHNIQUE: Contiguous axial images were obtained from the base of the skull through the vertex without intravenous contrast. COMPARISON:  None. FINDINGS: BRAIN: The ventricles and sulci are normal for age. No intraparenchymal hemorrhage, mass effect nor midline shift. Wedge-like hypodensity RIGHT cerebellum. Old small LEFT cerebellar infarct. Small area LEFT occipital lobe encephalomalacia. Patchy supratentorial white matter hypodensities within normal range for patient's age, though non-specific are most compatible with chronic small vessel ischemic disease. No acute large vascular territory infarcts. No abnormal extra-axial fluid collections. Basal cisterns are patent. VASCULAR: Mild calcific atherosclerosis of the carotid siphons. No dense MCA. SKULL: No skull fracture. No significant scalp soft tissue swelling. SINUSES/ORBITS: The mastoid air-cells and included paranasal sinuses are well-aerated.The included ocular globes and orbital contents are non-suspicious. OTHER: None. ASPECTS [facility] Stroke Program Early CT Score) - Ganglionic level infarction (caudate, lentiform nuclei, internal capsule, insula, M1-M3 cortex): 7 - Supraganglionic infarction (M4-M6 cortex): 3 Total score (0-10 with 10 being normal): 10 IMPRESSION: 1. Age indeterminate Small RIGHT cerebellar infarct.  Old LEFT occipital/PCA territory infarct. 2. ASPECTS is 10. Critical Value/emergent results were called by telephone at the time of interpretation on 07/03/2016 at 2:48 am to Dr. Leonel Ramsay, Neurology, who verbally acknowledged these results. Electronically Signed   By: Elon Alas M.D.   On: 07/03/2016 02:51     Steve Rattler, DO 07/04/2016, 7:53 AM PGY-1, [place] Intern pager: [phone], text pages welcome

## 2016-07-04 NOTE — Progress Notes (Signed)
  Echocardiogram 2D Echocardiogram has been performed.  Darlina Sicilian M 07/04/2016, 1:28 PM

## 2016-07-04 NOTE — Discharge Summary (Signed)
[facility] Discharge Summary  Patient name: Melanie Riddle Medical record number: [id number] Date of birth: 03-09-1951 Age: 65 y.o. Gender: female Date of Admission: 07/03/2016  Date of Discharge: 07/04/2016  Admitting Physician: Dickie La, MD  Primary Care Provider: Dimas Chyle, MD Consultants: neurology  Indication for Hospitalization: Left upper extremity numbness and tingling  Discharge Diagnoses/Problem List:  Patient Active Problem List   Diagnosis Date Noted  . PAF (paroxysmal atrial fibrillation) ([place])   . Cerebral embolism with cerebral infarction 07/03/2016  . Stroke ([place]) 07/03/2016  . TIA (transient ischemic attack)   . History of CVA (cerebrovascular accident)   . Numbness and tingling in left arm   . HLD (hyperlipidemia)   . Liver fibrosis ([place]) 06/17/2016  . Healthcare maintenance 12/24/2015  . Chronic hepatitis C without hepatic coma ([place]) 08/13/2015  . Decreased visual acuity 08/10/2015  . Atrial fibrillation ([place]) 09/28/2014  . Arthritis of hand 04/07/2012  . Ischemic optic neuropathy of right eye 03/17/2012  . Seasonal allergies 03/17/2012   Disposition: home  Discharge Condition: stable, improved  Discharge Exam:  General: pleasant lady laying in bed in no acute distress Cardiovascular: RRR no murmurs rubs or gallops Respiratory: CTAB no increased work of breathing Abdomen: soft nontender non-distended, +BS Extremities: no edema or cyanosis  Brief Hospital Course:  Melanie Jonesis a 65 y.o.female who presented to [facility] ED with left UE numbness and tingling. PMH is significant for hepatitis C with liver fibrosis and atrial fibrillation.  Left UE numbness and tingling Initially presented to ED with left arm numbness and tingling that resolved during hospitalization. CT head was negative for acute intracranial process. Neurology was consulted and followed along. MRI demonstrated remote right cerebellar and left PCA  infarcts. CTA head demonstrated left p3 severe stenosis and Left M1 moderate stenosis. CTA neck without evidence of significant stenosis. Echocardiogram demonstrated EF of 60-65% with no evidence of clot or PFO. Risk stratification labs were preformed, TSH and A1C normal.  Lipid panel results indicated ASCVD score of 11.3%. Patient was started on statin during hospitalization. Neurology recommended continuing aspirin and statin at discharge.   History of PAF History of being diagnosed with atrial fibrillation after colonoscopy in 2015. She was noted to have an irregularly irregular rhythm as an outpatient in March 2017 but denied anticoagulation at that time. Her CHADVASc score is now 4, possibly 5 if she is considered to have hypertension, giving her a risk of 5-7% annually for stroke. She continues to decline anticoagulation and would prefer to discuss this with her PCP. Echocardiogram was negative for clot in left atrium. Neurology saw patient and recommended 30 day event monitor to confirm or rule out atrial fibrillation and address anticoagulation at that time.  Elevated BP [name] had persistently elevated blood pressures during her hospitalization. She denied being diagnosed with high blood pressure in the outpatient setting. Systolic blood pressures ranged from 129-185. Patient believed her blood pressure was up due to stress from being hospitalized. She was recommended to consider antihypertensive medication but she was not agreeable to this. She would like to follow up with her PCP outpatient and confirm the diagnosis as an outpatient before considering adding on medication. She voiced understanding that hypertension increases her risk for having future strokes.  Issues for Follow Up:  1. Recommend starting blood pressure medication, patient did not want to start blood pressure medication in inpatient setting 2. Recommend starting anticoagulant, CHADS-VASc score of at least 4, possibly 5 if  patient is diagnosed with hypertension 3. Patient would benefit from 30-day cardiac event monitor 4. Address A1C result with patient, pending at time of discharge  Significant Procedures: echocardiogram  Significant Labs and Imaging:   Recent Labs Lab 07/03/16 0229 07/03/16 0249 07/03/16 0515 07/04/16 0531  WBC 9.0  --  8.5 6.2  HGB 12.7 13.3 12.4 12.0  HCT 37.5 39.0 36.5 36.0  PLT 191  --  173 160    Recent Labs Lab 07/03/16 0229 07/03/16 0249 07/03/16 0515 07/04/16 0531  NA 137 141 138 141  K 4.2 4.1 4.3 4.2  CL 104 101 105 109  CO2 26  --  28 27  GLUCOSE 102* 100* 98 94  BUN 14 17 13 7   CREATININE 0.68 0.70 0.66 0.63  CALCIUM 9.2  --  9.1 9.1  ALKPHOS 83  --   --   --   AST 23  --   --   --   ALT 16  --   --   --   ALBUMIN 3.7  --   --   --    TSH 0.919  Echo: 07/04/16  - Left ventricle: The cavity size was normal. Systolic function was   normal. The estimated ejection fraction was in the range of 55%   to 60%. Wall motion was normal; there were no regional wall   motion abnormalities. - Mitral valve: There was mild regurgitation. - Left atrium: 38 mm. The atrium was mildly dilated. - Atrial septum: No defect or patent foramen ovale was identified.  Ct Angio Head W Or Wo Contrast  Result Date: 07/03/2016 CLINICAL DATA:  Follow-up stroke.  LEFT sided weakness. EXAM: CT ANGIOGRAPHY HEAD AND NECK TECHNIQUE: Multidetector CT imaging of the head and neck was performed using the standard protocol during bolus administration of intravenous contrast. Multiplanar CT image reconstructions and MIPs were obtained to evaluate the vascular anatomy. Carotid stenosis measurements (when applicable) are obtained utilizing NASCET criteria, using the distal internal carotid diameter as the denominator. CONTRAST:  50 cc Isovue 370 COMPARISON:  CT HEAD July 03, 2016 at 0227 hours FINDINGS: CTA NECK AORTIC ARCH: Normal appearance of the thoracic arch, normal branch pattern. Trace  calcific atherosclerosis. The origins of the innominate, left Common carotid artery and subclavian artery are widely patent. RIGHT CAROTID SYSTEM: Common carotid artery is widely patent, coursing in a straight line fashion. Normal appearance of the carotid bifurcation without hemodynamically significant stenosis by NASCET criteria. Normal appearance of the included internal carotid artery. Mild focal beaded appearance of distal RIGHT cervical internal carotid artery. LEFT CAROTID SYSTEM: Common carotid artery is widely patent, coursing in a straight line fashion. Normal appearance of the carotid bifurcation without hemodynamically significant stenosis by NASCET criteria. Normal appearance of the included internal carotid artery. VERTEBRAL ARTERIES:Left vertebral artery is dominant. Normal appearance of the vertebral arteries, which appear widely patent. SKELETON: No acute osseous process though bone windows have not been submitted. Straightened cervical lordosis. Moderate to severe degenerative discs severe C4-5, C5-6 neural foraminal narrowing. OTHER NECK: Soft tissues of the neck are non-acute though, not tailored for evaluation. Mild fibronodular scarring lung apices. CTA HEAD ANTERIOR CIRCULATION: Normal appearance of the cervical internal carotid arteries, petrous, cavernous and supra clinoid internal carotid arteries. Widely patent anterior communicating artery. Patent anterior and middle cerebral arteries. Moderate stenosis distal LEFT M1 segment. Mild luminal irregularity compatible with atherosclerosis. No large vessel occlusion, hemodynamically significant stenosis, dissection, contrast extravasation or aneurysm. POSTERIOR CIRCULATION: Patent vertebral arteries, vertebrobasilar  junction and basilar artery, as well as main branch vessels. Mild stenosis proximal basilar artery. Patent posterior cerebral arteries. Severe stenosis LEFT P3 segment. Mild luminal regularity. No large vessel occlusion,  hemodynamically significant stenosis, dissection, contrast extravasation or aneurysm. VENOUS SINUSES: Major dural venous sinuses are patent though not tailored for evaluation on this angiographic examination. ANATOMIC VARIANTS: None. DELAYED PHASE: No abnormal intracranial enhancement. IMPRESSION: CTA NECK: No hemodynamically significant stenosis or acute vascular process. Focal suspected fibromuscular dysplasia distal RIGHT cervical internal carotid artery. CTA HEAD: No emergent large vessel. Severe stenosis LEFT P3 segment.  Moderate stenosis LEFT M1 segment. Electronically Signed   By: Elon Alas M.D.   On: 07/03/2016 04:46   Ct Angio Neck W Or Wo Contrast  Result Date: 07/03/2016 CLINICAL DATA:  Follow-up stroke.  LEFT sided weakness. EXAM: CT ANGIOGRAPHY HEAD AND NECK TECHNIQUE: Multidetector CT imaging of the head and neck was performed using the standard protocol during bolus administration of intravenous contrast. Multiplanar CT image reconstructions and MIPs were obtained to evaluate the vascular anatomy. Carotid stenosis measurements (when applicable) are obtained utilizing NASCET criteria, using the distal internal carotid diameter as the denominator. CONTRAST:  50 cc Isovue 370 COMPARISON:  CT HEAD July 03, 2016 at 0227 hours FINDINGS: CTA NECK AORTIC ARCH: Normal appearance of the thoracic arch, normal branch pattern. Trace calcific atherosclerosis. The origins of the innominate, left Common carotid artery and subclavian artery are widely patent. RIGHT CAROTID SYSTEM: Common carotid artery is widely patent, coursing in a straight line fashion. Normal appearance of the carotid bifurcation without hemodynamically significant stenosis by NASCET criteria. Normal appearance of the included internal carotid artery. Mild focal beaded appearance of distal RIGHT cervical internal carotid artery. LEFT CAROTID SYSTEM: Common carotid artery is widely patent, coursing in a straight line fashion.  Normal appearance of the carotid bifurcation without hemodynamically significant stenosis by NASCET criteria. Normal appearance of the included internal carotid artery. VERTEBRAL ARTERIES:Left vertebral artery is dominant. Normal appearance of the vertebral arteries, which appear widely patent. SKELETON: No acute osseous process though bone windows have not been submitted. Straightened cervical lordosis. Moderate to severe degenerative discs severe C4-5, C5-6 neural foraminal narrowing. OTHER NECK: Soft tissues of the neck are non-acute though, not tailored for evaluation. Mild fibronodular scarring lung apices. CTA HEAD ANTERIOR CIRCULATION: Normal appearance of the cervical internal carotid arteries, petrous, cavernous and supra clinoid internal carotid arteries. Widely patent anterior communicating artery. Patent anterior and middle cerebral arteries. Moderate stenosis distal LEFT M1 segment. Mild luminal irregularity compatible with atherosclerosis. No large vessel occlusion, hemodynamically significant stenosis, dissection, contrast extravasation or aneurysm. POSTERIOR CIRCULATION: Patent vertebral arteries, vertebrobasilar junction and basilar artery, as well as main branch vessels. Mild stenosis proximal basilar artery. Patent posterior cerebral arteries. Severe stenosis LEFT P3 segment. Mild luminal regularity. No large vessel occlusion, hemodynamically significant stenosis, dissection, contrast extravasation or aneurysm. VENOUS SINUSES: Major dural venous sinuses are patent though not tailored for evaluation on this angiographic examination. ANATOMIC VARIANTS: None. DELAYED PHASE: No abnormal intracranial enhancement. IMPRESSION: CTA NECK: No hemodynamically significant stenosis or acute vascular process. Focal suspected fibromuscular dysplasia distal RIGHT cervical internal carotid artery. CTA HEAD: No emergent large vessel. Severe stenosis LEFT P3 segment.  Moderate stenosis LEFT M1 segment.  Electronically Signed   By: Elon Alas M.D.   On: 07/03/2016 04:46   Mr Brain Wo Contrast  Result Date: 07/03/2016 CLINICAL DATA:  Upper extremity weakness EXAM: MRI HEAD WITHOUT CONTRAST TECHNIQUE: Multiplanar, multiecho pulse sequences of the brain  and surrounding structures were obtained without intravenous contrast. COMPARISON:  Head CT 07/03/2016 FINDINGS: Brain: No acute infarct or intraparenchymal hemorrhage. The midline structures are normal. There is periventricular and subcortical white matter hyperintensity compatible with chronic microvascular disease. There is an old right cerebellar infarct and left occipital encephalomalacia. No mass lesion or midline shift. No hydrocephalus or extra-axial fluid collection. Vascular: Major intracranial flow voids are preserved. Evidence of old hemorrhage in the left occipital lobe and chronic single microhemorrhage in the superior right parietal lobe. Skull and upper cervical spine: The visualized skull base, calvarium, upper cervical spine and extracranial soft tissues are normal. Sinuses/Orbits: No fluid levels or advanced mucosal thickening. No mastoid effusion. Normal orbits. IMPRESSION: 1. No acute intracranial abnormality. 2. Findings of chronic microvascular disease with old posterior circulation infarcts. Electronically Signed   By: Ulyses Jarred M.D.   On: 07/03/2016 13:34   Ct Head Code Stroke W/o Cm  Result Date: 07/03/2016 CLINICAL DATA:  Code stroke. LEFT arm sided numbness. History of sickle cell trait, atrial fibrillation. EXAM: CT HEAD WITHOUT CONTRAST TECHNIQUE: Contiguous axial images were obtained from the base of the skull through the vertex without intravenous contrast. COMPARISON:  None. FINDINGS: BRAIN: The ventricles and sulci are normal for age. No intraparenchymal hemorrhage, mass effect nor midline shift. Wedge-like hypodensity RIGHT cerebellum. Old small LEFT cerebellar infarct. Small area LEFT occipital lobe  encephalomalacia. Patchy supratentorial white matter hypodensities within normal range for patient's age, though non-specific are most compatible with chronic small vessel ischemic disease. No acute large vascular territory infarcts. No abnormal extra-axial fluid collections. Basal cisterns are patent. VASCULAR: Mild calcific atherosclerosis of the carotid siphons. No dense MCA. SKULL: No skull fracture. No significant scalp soft tissue swelling. SINUSES/ORBITS: The mastoid air-cells and included paranasal sinuses are well-aerated.The included ocular globes and orbital contents are non-suspicious. OTHER: None. ASPECTS [facility] Stroke Program Early CT Score) - Ganglionic level infarction (caudate, lentiform nuclei, internal capsule, insula, M1-M3 cortex): 7 - Supraganglionic infarction (M4-M6 cortex): 3 Total score (0-10 with 10 being normal): 10 IMPRESSION: 1. Age indeterminate Small RIGHT cerebellar infarct. Old LEFT occipital/PCA territory infarct. 2. ASPECTS is 10. Critical Value/emergent results were called by telephone at the time of interpretation on 07/03/2016 at 2:48 am to Dr. Leonel Ramsay, Neurology, who verbally acknowledged these results. Electronically Signed   By: Elon Alas M.D.   On: 07/03/2016 02:51     Results/Tests Pending at Time of Discharge: A1C  Discharge Medications:    Medication List    STOP taking these medications   vitamin E 400 UNIT capsule     TAKE these medications   aspirin 325 MG tablet Take 1 tablet (325 mg total) by mouth daily.   atorvastatin 40 MG tablet Commonly known as:  LIPITOR Take 1 tablet (40 mg total) by mouth daily.   hydroxypropyl methylcellulose / hypromellose 2.5 % ophthalmic solution Commonly known as:  ISOPTO TEARS / GONIOVISC Place 1 drop into both eyes 3 (three) times daily as needed for dry eyes.   multivitamin with minerals Tabs tablet Take 1 tablet by mouth daily.   PROBIOTIC PO Take 1 tablet by mouth daily.   vitamin C 500  MG tablet Commonly known as:  ASCORBIC ACID Take 1,000 mg by mouth daily.       Discharge Instructions: Please refer to Patient Instructions section of EMR for full details.  Patient was counseled important signs and symptoms that should prompt return to medical care, changes in medications, dietary instructions, activity restrictions,  and follow up appointments.   Follow-Up Appointments: Follow-up Information    MARTIN,NANCY CAROLYN, NP Follow up in 6 week(s).   Specialty:  Family Medicine Why:  stroke follow up Contact information: [address] [place] [address] [phone]        Dimas Chyle, MD. Go on 07/14/2016.   Specialty:  Family Medicine Why:  at 2:45pm Contact information: [id number] [id number] [place], DO 07/05/2016, 9:47 AM PGY-1, [place]

## 2016-07-04 NOTE — Care Management Note (Signed)
Case Management Note  Patient Details  Name: Melanie Riddle MRN: [id number] Date of Birth: 08/20/1951  Subjective/Objective:             Presented with UE numbness and tingling . PMH is significant for hepatitis C with liver fibrosis and atrial fibrillation. Independent with ADL's PTA,         PCP: Dimas Chyle  Action/Plan: Plan possible d/c today after TIA/CVA workup completed.  Expected Discharge Date:    07/04/2016          Expected Discharge Plan:  Home/Self Care (Lives alone)  In-House Referral:     Discharge planning Services  CM Consult  Post Acute Care Choice:    Choice offered to:     DME Arranged:    DME Agency:     HH Arranged:    [place] Agency:     Status of Service:  Completed, signed off  If discussed at [employer], dates discussed:    Additional Comments:  Sharin Mons, RN 07/04/2016, 9:46 AM

## 2016-07-04 NOTE — Consult Note (Signed)
   [facility] CM Inpatient Consult   07/04/2016  Melanie Riddle 01-Mar-1951 [id number]    [facility] Care Management referral received. Went to bedside to speak with patient about [place] Management program. However, she had already been discharged. Spoke with inpatient RNCM to make aware.  Marthenia Rolling, MSN-Ed, RN,BSN [facility] Liaison [phone]

## 2016-07-05 LAB — HEMOGLOBIN A1C
HEMOGLOBIN A1C: 5.3 % (ref 4.8–5.6)
MEAN PLASMA GLUCOSE: 105 mg/dL

## 2016-07-08 ENCOUNTER — Other Ambulatory Visit: Payer: Self-pay | Admitting: *Deleted

## 2016-07-08 NOTE — Patient Outreach (Signed)
[place] [facility]) Care Management  07/08/2016  Melanie Riddle [birth year] [id number]   Referral received from hospital liaison to initiate transition of care program once member discharged.  She was admitted for stroke, discharged 9/29.  According to chart, she also has history of atrial fibrillation and TIA.  Call placed to member, no answer.  HIPAA compliant voice message left, will await call back.  If no call back, will make second attempt tomorrow.  Valente David, [place], MSN [place] [phone]

## 2016-07-09 ENCOUNTER — Encounter: Payer: Self-pay | Admitting: *Deleted

## 2016-07-09 ENCOUNTER — Other Ambulatory Visit: Payer: Self-pay | Admitting: *Deleted

## 2016-07-09 NOTE — Patient Outreach (Signed)
[place] [facility]) Care Management  07/09/2016  Callyn Krick Jul 20, 1951 [id number]   Second attempt made to contact member, successful.  Identity verified.  [facility] care management services explained, member denies offer for services.  She report that she is self care and has no needs managing her care at this time.  Her admission was for observation after stroke/TIA, she reports compliance with medications and has follow up appointment scheduled.  She state she has contact information and will contact if needs change.    Will notify care management assistant and PCP of case closure.  Valente David, [place], MSN [place] [phone]

## 2016-07-14 ENCOUNTER — Ambulatory Visit (INDEPENDENT_AMBULATORY_CARE_PROVIDER_SITE_OTHER): Payer: Medicare Other | Admitting: Family Medicine

## 2016-07-14 ENCOUNTER — Encounter: Payer: Self-pay | Admitting: Family Medicine

## 2016-07-14 DIAGNOSIS — I480 Paroxysmal atrial fibrillation: Secondary | ICD-10-CM | POA: Diagnosis not present

## 2016-07-14 DIAGNOSIS — I639 Cerebral infarction, unspecified: Secondary | ICD-10-CM

## 2016-07-14 DIAGNOSIS — G459 Transient cerebral ischemic attack, unspecified: Secondary | ICD-10-CM

## 2016-07-14 MED ORDER — ASPIRIN 81 MG PO TABS: 325.0000 mg | ORAL_TABLET | Freq: Every day | ORAL | 11 refills | Status: DC

## 2016-07-14 NOTE — Assessment & Plan Note (Signed)
Seemed to be in regular rhythm today. Given her remote strokes noted on MRI, Chadsvasc score is 4. Patient is still very against starting anticoagulation at this point and understands that not being on anticoagulation increases her risk for stroke. Will continue with aspirin.

## 2016-07-14 NOTE — Progress Notes (Signed)
    Subjective:  Melanie Riddle is a 65 y.o. female who presents to the [facility] today with a chief complaint of hospital follow up.   HPI:  TIA Patient admitted to the hospital 2 weeks ago with LUE numbness and tingling. Patient had negative work up for acute stroke, however did have evidence of remote right cerebellar and left PCA strokes. She had a normal echocardiogram. Her symptoms quickly resolved during her hospitalization and she has had no further episodes since. She was started on a statin, however has since stopped.   PAF Patient also counseled on starting anticoagulation during her hospitalization. Patient deferred and is still not interested in starting. She is currently on aspirin 325mg  daily.   Hypertension BP Readings from Last 3 Encounters:  07/14/16 124/70  07/04/16 129/68  06/17/16 118/82   Home BP monitoring-Yes Compliant with medications-No medications ROS-Denies any CP, HA, SOB, blurry vision, LE edema, transient weakness, orthopnea, PND.   ROS: Per HPI  PMH: Smoking history reviewed.    Objective:  Physical Exam: BP 124/70   Pulse (!) 55   Temp 98.3 F (36.8 C) (Oral)   Ht 5\' 2"  (1.575 m)   Wt 155 lb (70.3 kg)   BMI 28.35 kg/m   Gen: NAD, resting comfortably CV: Bradycardic, regular. No murmurs noted.  Pulm: NWOB, CTAB with no crackles, wheezes, or rhonchi GI: Normal bowel sounds present. Soft, Nontender, Nondistended. MSK: no edema, cyanosis, or clubbing noted Skin: warm, dry Neuro: CN2-12 intact. Strength 5/5 in upper and lower extremities. Sensation to touch intact.  Psych: Normal affect and thought content  Assessment/Plan:  TIA (transient ischemic attack) Symptoms currently resolved. Patient on aspirin, but has deferred treatment with a statin. Calculated patient's 10-year ASCVD risk to be 7.2% currently. Told patient that starting continuing statin therapy would lower this risk significantly. Patient voiced understanding and still wished to defer  starting statin.   Atrial fibrillation Seemed to be in regular rhythm today. Given her remote strokes noted on MRI, Chadsvasc score is 4. Patient is still very against starting anticoagulation at this point and understands that not being on anticoagulation increases her risk for stroke. Will continue with aspirin.   Melanie Riddle. Jerline Pain, [place] Resident PGY-3 07/14/2016 4:53 PM

## 2016-07-14 NOTE — Patient Instructions (Signed)
We will stop your cholesterol medication today. Your 10 year risk of having a heart attack or stroke is 7.2%. If we start a cholesterol medication, that risk is reduced to 5.7%  We will change your aspirin to 81mg .  Come back to see me in 6 months, or sooner if you need anything else.  Take care,  Dr Jerline Pain

## 2016-07-14 NOTE — Assessment & Plan Note (Signed)
Symptoms currently resolved. Patient on aspirin, but has deferred treatment with a statin. Calculated patient's 10-year ASCVD risk to be 7.2% currently. Told patient that starting continuing statin therapy would lower this risk significantly. Patient voiced understanding and still wished to defer starting statin.

## 2016-07-17 DIAGNOSIS — Z23 Encounter for immunization: Secondary | ICD-10-CM | POA: Diagnosis not present

## 2016-08-25 ENCOUNTER — Ambulatory Visit (INDEPENDENT_AMBULATORY_CARE_PROVIDER_SITE_OTHER): Payer: Medicare Other | Admitting: Nurse Practitioner

## 2016-08-25 ENCOUNTER — Encounter: Payer: Self-pay | Admitting: Nurse Practitioner

## 2016-08-25 VITALS — BP 122/78 | HR 87 | Ht 62.0 in | Wt 156.6 lb

## 2016-08-25 DIAGNOSIS — I480 Paroxysmal atrial fibrillation: Secondary | ICD-10-CM

## 2016-08-25 DIAGNOSIS — G459 Transient cerebral ischemic attack, unspecified: Secondary | ICD-10-CM

## 2016-08-25 DIAGNOSIS — I639 Cerebral infarction, unspecified: Secondary | ICD-10-CM | POA: Diagnosis not present

## 2016-08-25 DIAGNOSIS — E785 Hyperlipidemia, unspecified: Secondary | ICD-10-CM

## 2016-08-25 NOTE — Progress Notes (Signed)
I agree with the above plan 

## 2016-08-25 NOTE — Patient Instructions (Addendum)
Stressed the importance of management of risk factors to prevent further stroke Continue aspirin for secondary stroke prevention Maintain strict control of hypertension with blood pressure goal below 130/90, today's reading 122/78 Control of diabetes with hemoglobin A1c below 6.5 followed by primary care most recent hemoglobin A1c5.3 continue diabetic medications Cholesterol with LDL cholesterol less than 70, followed by primary care,  most recent 84 has refused  statin drugs Exercise by walking, slowly increase , eat healthy diet with whole grains,  fresh fruits and vegetables Discussed risk for recurrent stroke/ TIA and answered additional questions Follow up 4 months

## 2016-08-25 NOTE — Progress Notes (Signed)
[facility]  PATIENT: Melanie Riddle DOB: 16-May-1951   REASON FOR VISIT: hospital follow-up for stroke HISTORY FROM:patient    HISTORY OF PRESENT ILLNESS:Melanie Jonesis a 65 y.o.femalewho presents with left-sided weakness that started around 11:30 PM on 07/03/2016. She has not been to sleep. She states that she has been worried about it, but it has been pretty static since onset. Due to continuing concern, she decided to seek medical care. In the ER, a code stroke was called and she does have an NIH stroke scale of 4, but her deficits are very mild and therefore tPA was not administered. She was admitted for further evaluation and treatment. MRI of the brain no acute stroke but remote right cerebellar and left PCA infarcts. CTA of the head LP 3 severe stenosis LM1 moderate stenosis. CTA of the neck no significant stenosis. 2-D echo 60-65% EF. 30 day event monitoring was recommended however patient has refused. LDL 84. She has also refused statins. Hemoglobin A1c 5.3 she was not on aspirin prior to admission. She returns to the clinic today for stroke follow-up. She is currently on aspirin 0.81 daily. Blood pressure well controlled in the office today at 122/78 she has not had further stroke or TIA symptoms. She plans to get back into her exercise routine the first of the year. She returns for reevaluation    REVIEW OF SYSTEMS: Full 14 system review of systems performed and notable only for those listed, all others are neg:  Constitutional: neg  Cardiovascular: neg Ear/Nose/Throat: neg  Skin: neg Eyes: neg Respiratory: neg Gastroitestinal: neg  Hematology/Lymphatic: Easy bruising   Endocrine: neg Musculoskeletal:neg Allergy/Immunology: neg Neurological: neg Psychiatric: neg Sleep : neg   ALLERGIES: Allergies  Allergen Reactions  . Penicillins Swelling    HOME MEDICATIONS: Outpatient Medications Prior to Visit  Medication Sig Dispense Refill  . aspirin 81 MG  tablet Take 4 tablets (325 mg total) by mouth daily. 30 tablet 11  . hydroxypropyl methylcellulose / hypromellose (ISOPTO TEARS / GONIOVISC) 2.5 % ophthalmic solution Place 1 drop into both eyes 3 (three) times daily as needed for dry eyes.    . Multiple Vitamin (MULTIVITAMIN WITH MINERALS) TABS tablet Take 1 tablet by mouth daily.    . Probiotic Product (PROBIOTIC PO) Take 1 tablet by mouth daily.    . vitamin C (ASCORBIC ACID) 500 MG tablet Take 1,000 mg by mouth daily.     No facility-administered medications prior to visit.     PAST MEDICAL HISTORY: Past Medical History:  Diagnosis Date  . Allergy   . Arthritis of hand 04/07/2012  . Atrial fibrillation ([place]) 09/28/2014  . Decreased visual acuity 08/10/2015  . Ischemic optic neuropathy of right eye 03/17/2012   Asked for optometry note.   . Paroxysmal atrial fibrillation ([place]) 06/28/14   single episodes occured during recovery post colonoscopy  . Seasonal allergies 03/17/2012  . Sickle cell trait ([place])   . Unspecified viral hepatitis C without hepatic coma 08/13/2015    PAST SURGICAL HISTORY: Past Surgical History:  Procedure Laterality Date  . CESAREAN SECTION    . SALPINGECTOMY Right 2005   Dr Ronnald Ramp  . TUMOR EXCISION Right    benign right thigh, [place] [facility]    FAMILY HISTORY: Family History  Problem Relation Age of Onset  . Kidney disease Father   . Sickle cell anemia Brother   . Hyperlipidemia Brother   . Stroke Brother   . Cancer Maternal Aunt     lung cancer  .  Colon cancer Neg Hx     SOCIAL HISTORY: Social History   Social History  . Marital status: Divorced    Spouse name: N/A  . Number of children: N/A  . Years of education: N/A   Occupational History  . Not on file.   Social History Main Topics  . Smoking status: Former Research scientist (life sciences)  . Smokeless tobacco: Never Used  . Alcohol use 0.0 oz/week     Comment: wine 3-7 per week  . Drug use: No  . Sexual activity: Yes    Partners: Male     Comment: pt  declined condoms   Other Topics Concern  . Not on file   Social History Narrative   Health Risk Assessment      Behavioral Risks   Exercise   Greater than 20 minutes/day for greater than 3 days/week: yes, some of the time      [place] with your teeth or dentures: no      Alcohol Use   4 or more alcoholic drinks in a day in the last year: no      Motor Vehicle Safety   Difficulty driving car: no   Seatbelt usage: yes      Medication Adherence   Trouble taking medicines as directed: I don't take medicines      Psychosocial Risks      Loneliness / Social Isolation      Living alone: yes      Someone available to help or talk:yes      Recent limitation of social activity: not at [place] last 4 weeks: excellent      Home safety      Working smoke alarm: yes      Home throw rugs: yes      Non-slip mats in shower or bathtub: yes      Railings on home stairs: Not applicable      Home free from clutter: yes      Persons helping take care of patient at home: No one.          Emergency contact person(s):      Name      Relationship        Contact phone number       Melanie Riddle                    Daughter                          [phone]      PHYSICAL EXAM  Vitals:   08/25/16 0923  BP: 122/78  Pulse: 87  Weight: 156 lb 9.6 oz (71 kg)  Height: 5\' 2"  (1.575 m)   Body mass index is 28.64 kg/m.  Generalized: Well developed, in no acute distress  Head: normocephalic and atraumatic,. Oropharynx benign  Neck: Supple, no carotid bruits  Cardiac: Regular rate rhythm, no murmur  Musculoskeletal: No deformity   Neurological examination   Mentation: Alert oriented to time, place, history taking. Attention span and concentration appropriate. Recent and remote memory intact.  Follows all commands speech and language fluent.   Cranial nerve II-XII: Fundoscopic exam reveals sharp disc margins.Pupils  were equal round reactive to light extraocular movements were full, visual field were full on confrontational test. Facial sensation and strength were normal. hearing was intact to finger rubbing bilaterally. Uvula  tongue midline. head turning and shoulder shrug were normal and symmetric.Tongue protrusion into cheek strength was normal. Motor: normal bulk and tone, full strength in the BUE, BLE, fine finger movements normal, no pronator drift. No focal weakness Sensory: normal and symmetric to light touch, pinprick, and  Vibration, In the upper and lower extremities Coordination: finger-nose-finger, heel-to-shin bilaterally, no dysmetria Reflexes: 1+ upper lower and symmetric plantar responses were flexor bilaterally. Gait and Station: Rising up from seated position without assistance, normal stance,  moderate stride, good arm swing, smooth turning, able to perform tiptoe, and heel walking without difficulty. Tandem gait is steady  DIAGNOSTIC DATA (LABS, IMAGING, TESTING) - I reviewed patient records, labs, notes, testing and imaging myself where available.  Lab Results  Component Value Date   WBC 6.2 07/04/2016   HGB 12.0 07/04/2016   HCT 36.0 07/04/2016   MCV 90.9 07/04/2016   PLT 160 07/04/2016      Component Value Date/Time   NA 141 07/04/2016 0531   K 4.2 07/04/2016 0531   CL 109 07/04/2016 0531   CO2 27 07/04/2016 0531   GLUCOSE 94 07/04/2016 0531   BUN 7 07/04/2016 0531   CREATININE 0.63 07/04/2016 0531   CREATININE 0.64 12/19/2015 1508   CALCIUM 9.1 07/04/2016 0531   PROT 7.1 07/03/2016 0229   ALBUMIN 3.7 07/03/2016 0229   AST 23 07/03/2016 0229   ALT 16 07/03/2016 0229   ALKPHOS 83 07/03/2016 0229   BILITOT 0.8 07/03/2016 0229   GFRNONAA >60 07/04/2016 0531   GFRAA >60 07/04/2016 0531   Lab Results  Component Value Date   CHOL 146 07/03/2016   HDL 45 07/03/2016   LDLCALC 84 07/03/2016   LDLDIRECT 97 04/07/2012   TRIG 83 07/03/2016   CHOLHDL 3.2 07/03/2016   Lab  Results  Component Value Date   HGBA1C 5.3 07/04/2016   No results found for: [id number] Lab Results  Component Value Date   TSH 0.919 07/03/2016      ASSESSMENT AND PLAN  65 y.o. year old female  has a past medical history of Allergy; Arthritis of hand (04/07/2012); Atrial fibrillation ([place]) (09/28/2014); Decreased visual acuity (08/10/2015); Ischemic optic neuropathy of right eye (03/17/2012); Paroxysmal atrial fibrillation ([place]) (06/28/14);  here To follow-up for right brain TIA etiology unclear. MRI did not show acute stroke however evidence of remote right cerebellar and left PCA strokes. Patient refuses to be on statin drug. She refuses to have cardiac event monitoring she has refused anticoagulation for her atrial fibrillation.The patient is a current patient of Dr. Erlinda Hong  who is out of the office today . This note is sent to the work in doctor.      PLAN Stressed the importance of management of risk factors to prevent further stroke Continue aspirin for secondary stroke prevention Maintain strict control of hypertension with blood pressure goal below 130/90, today's reading 122/78 Control of diabetes with hemoglobin A1c below 6.5 followed by primary care most recent hemoglobin A1c5.3 continue diabetic medications Cholesterol with LDL cholesterol less than 70, followed by primary care,  most recent 84 has refused  statin drugs Exercise by walking, slowly increase , eat healthy diet with whole grains,  fresh fruits and vegetables Discussed risk for recurrent stroke/ TIA and answered additional questions Follow up 4 months This was a  visit requiring 25 minutes of extensive review of history, hospital chart, counseling and answering questions Dennie Bible, [facility], [facility], APRN  [facility] [address] [place], [place]  [address] [phone] 

## 2016-09-16 NOTE — Progress Notes (Signed)
   07/03/16 1430  OT Visit Information  Last OT Received On 07/03/16  History of Present Illness Gatha Amon is a 65 y.o. female presenting with UE numbness and tingling . PMH is significant for hepatitis C with liver fibrosis and atrial fibrillation.  OT G-codes **NOT FOR INPATIENT CLASS**  Self Care Goal Status [id number]) [place]   Late entry for g-code goal status.  Tyrone Schimke OTR/L Pager: [phone]

## 2016-09-21 NOTE — Progress Notes (Signed)
OT Note - Diagnosis Addendum    07/03/16 1405  OT Assessment  OT Problem List Other (comment) (other symptoms and signs involving nervous system)  [facility], OT/L  [phone] 09/21/2016

## 2016-09-22 ENCOUNTER — Encounter: Payer: Self-pay | Admitting: Family Medicine

## 2016-09-22 ENCOUNTER — Ambulatory Visit (INDEPENDENT_AMBULATORY_CARE_PROVIDER_SITE_OTHER): Payer: Medicare Other | Admitting: Family Medicine

## 2016-09-22 VITALS — BP 122/88 | HR 79 | Temp 97.5°F | Ht 62.0 in | Wt 155.4 lb

## 2016-09-22 DIAGNOSIS — G609 Hereditary and idiopathic neuropathy, unspecified: Secondary | ICD-10-CM

## 2016-09-22 DIAGNOSIS — Z1239 Encounter for other screening for malignant neoplasm of breast: Secondary | ICD-10-CM

## 2016-09-22 DIAGNOSIS — Z1231 Encounter for screening mammogram for malignant neoplasm of breast: Secondary | ICD-10-CM | POA: Diagnosis not present

## 2016-09-22 DIAGNOSIS — I639 Cerebral infarction, unspecified: Secondary | ICD-10-CM | POA: Diagnosis not present

## 2016-09-22 LAB — CBC
HEMATOCRIT: 40.9 % (ref 35.0–45.0)
Hemoglobin: 13.7 g/dL (ref 11.7–15.5)
MCH: 30.6 pg (ref 27.0–33.0)
MCHC: 33.5 g/dL (ref 32.0–36.0)
MCV: 91.5 fL (ref 80.0–100.0)
MPV: 11.4 fL (ref 7.5–12.5)
Platelets: 233 10*3/uL (ref 140–400)
RBC: 4.47 MIL/uL (ref 3.80–5.10)
RDW: 13.1 % (ref 11.0–15.0)
WBC: 8.6 10*3/uL (ref 3.8–10.8)

## 2016-09-22 LAB — BASIC METABOLIC PANEL WITH GFR
BUN: 17 mg/dL (ref 7–25)
CALCIUM: 9.6 mg/dL (ref 8.6–10.4)
CO2: 27 mmol/L (ref 20–31)
Chloride: 105 mmol/L (ref 98–110)
Creat: 0.78 mg/dL (ref 0.50–0.99)
GFR, Est Non African American: 79 mL/min (ref >=60)
GLUCOSE: 96 mg/dL (ref 65–99)
Potassium: 4.2 mmol/L (ref 3.5–5.3)
Sodium: 141 mmol/L (ref 135–146)

## 2016-09-22 LAB — VITAMIN B12: Vitamin B-12: 1226 pg/mL — ABNORMAL HIGH (ref 200–1100)

## 2016-09-22 NOTE — Assessment & Plan Note (Signed)
Broad differential for patient's bilateral upper extremity weakness. Normal exam today. Given her history of TIA and prior small strokes, it is possible that this was another TIA, however it would be unusual for it to affect her bilaterally. Patient has been counseled on the risks of afib and stroke, however continues to defer anticoagulation and statins. She is taking a baby aspirin and fish oil. She has been worked up for stroke less than 3 months ago including echo and MRI. Given her vegan diet, it is possible that she is B12 deficient, though would not expect prompt resolution with a single oral dose of B12. Will check CBC, TSH, and B12 today. Alcohol use may be contributing, but again would not expect prompt resolution. If episode occurs again or is more persistent, would consider visit with neurology for further testing, possibly including nerve conduction studies vs further CNS imaging.

## 2016-09-22 NOTE — Progress Notes (Signed)
    Subjective:  Melanie Riddle is a 65 y.o. female who presents to the [facility] today with a chief complaint of UE numbness.   HPI:  Bilateral UE Numbness. Three days ago, patient noticed a sudden onset of bilateral UE numbness that started in her hands and progressed to her shoulders. Her arms felt a little "clumsy" during this time but she was able to move her arms. Symptoms persisted for about a day until the next day when she took a B12 supplement. Patient reports noticing an "immediate" difference. Within hours the numbness subsided completely. Patient is concerned that she may have a B12 deficiency. She says that she has been a lifelong vegetarian, but over the past 3-4 months, she has went on a completely vegan diet and has not been supplementing with B12.     Patient has an occasional glass of wine or two every evening. Denies heavy alcohol use.   ROS: Per HPI  PMH: Smoking history reviewed.    Objective:  Physical Exam: BP 122/88 (BP Location: Left Arm, Patient Position: Sitting, Cuff Size: Normal)   Pulse 79   Temp 97.5 F (36.4 C) (Oral)   Ht 5\' 2"  (1.575 m)   Wt 155 lb 6.4 oz (70.5 kg)   SpO2 99%   BMI 28.42 kg/m   Gen: NAD, resting comfortably CV: RRR with no murmurs appreciated Pulm: NWOB, CTAB with no crackles, wheezes, or rhonchi GI: Normal bowel sounds present. Soft, Nontender, Nondistended. MSK: no edema, cyanosis, or clubbing noted Skin: warm, dry Neuro: CN2-12 intact. Strength in upper extremities 5/5 in all fields. Sensation to light touch intact throughout. Psych: Normal affect and thought content   Assessment/Plan:  Hereditary and idiopathic peripheral neuropathy Broad differential for patient's bilateral upper extremity weakness. Normal exam today. Given her history of TIA and prior small strokes, it is possible that this was another TIA, however it would be unusual for it to affect her bilaterally. Patient has been counseled on the risks of afib and stroke,  however continues to defer anticoagulation and statins. She is taking a baby aspirin and fish oil. She has been worked up for stroke less than 3 months ago including echo and MRI. Given her vegan diet, it is possible that she is B12 deficient, though would not expect prompt resolution with a single oral dose of B12. Will check CBC, TSH, and B12 today. Alcohol use may be contributing, but again would not expect prompt resolution. If episode occurs again or is more persistent, would consider visit with neurology for further testing, possibly including nerve conduction studies vs further CNS imaging.    Algis Greenhouse. Jerline Pain, [facility] Resident PGY-3 09/22/2016 3:43 PM

## 2016-09-22 NOTE — Patient Instructions (Signed)
We will check blood work today including your B12 level.  If the tests are negative, we may have to send you back to neurology.  Please schedule your mammogram.  Take care,  Dr Jerline Pain

## 2016-09-23 LAB — TSH: TSH: 0.57 mIU/L

## 2016-09-24 ENCOUNTER — Telehealth: Payer: Self-pay | Admitting: Family Medicine

## 2016-09-24 NOTE — Telephone Encounter (Signed)
Called patient to discuss results. All normal. Patient with no further episodes of numbness. Instructed patient to let us know if it happened again. If recurs, would consider referral back to neurology for further testing (possible nerve conduction studies and/or imaging). Patient had no further questions.  Melanie Riddle. Jerline Pain, [place] Resident PGY-3 09/24/2016 9:49 AM

## 2016-09-26 ENCOUNTER — Other Ambulatory Visit: Payer: Self-pay | Admitting: Family Medicine

## 2016-09-26 DIAGNOSIS — Z1231 Encounter for screening mammogram for malignant neoplasm of breast: Secondary | ICD-10-CM

## 2016-09-30 ENCOUNTER — Other Ambulatory Visit: Payer: Self-pay | Admitting: Family Medicine

## 2016-09-30 DIAGNOSIS — N6320 Unspecified lump in the left breast, unspecified quadrant: Secondary | ICD-10-CM

## 2016-10-20 ENCOUNTER — Other Ambulatory Visit: Payer: Self-pay

## 2016-10-20 ENCOUNTER — Other Ambulatory Visit: Payer: Self-pay | Admitting: Family Medicine

## 2016-10-20 DIAGNOSIS — N6320 Unspecified lump in the left breast, unspecified quadrant: Secondary | ICD-10-CM

## 2016-10-21 ENCOUNTER — Ambulatory Visit: Payer: Medicare Other

## 2016-10-21 ENCOUNTER — Ambulatory Visit
Admission: RE | Admit: 2016-10-21 | Discharge: 2016-10-21 | Disposition: A | Discharge status: Home / Self Care | Payer: Medicare Other | Source: Ambulatory Visit | Attending: Family Medicine | Admitting: Family Medicine

## 2016-10-21 DIAGNOSIS — N6320 Unspecified lump in the left breast, unspecified quadrant: Secondary | ICD-10-CM

## 2016-10-21 DIAGNOSIS — R928 Other abnormal and inconclusive findings on diagnostic imaging of breast: Secondary | ICD-10-CM | POA: Diagnosis not present

## 2016-12-09 ENCOUNTER — Other Ambulatory Visit: Payer: Medicare Other

## 2016-12-09 DIAGNOSIS — B182 Chronic viral hepatitis C: Secondary | ICD-10-CM

## 2016-12-11 LAB — HEPATITIS C RNA QUANTITATIVE
HCV QUANT: NOT DETECTED [IU]/mL
HCV Quantitative Log: <1.18 Log IU/mL

## 2016-12-22 ENCOUNTER — Encounter: Payer: Self-pay | Admitting: Nurse Practitioner

## 2016-12-22 ENCOUNTER — Ambulatory Visit (INDEPENDENT_AMBULATORY_CARE_PROVIDER_SITE_OTHER): Payer: Medicare Other | Admitting: Nurse Practitioner

## 2016-12-22 VITALS — BP 124/82 | HR 95 | Ht 62.0 in | Wt 158.8 lb

## 2016-12-22 DIAGNOSIS — E785 Hyperlipidemia, unspecified: Secondary | ICD-10-CM

## 2016-12-22 DIAGNOSIS — G459 Transient cerebral ischemic attack, unspecified: Secondary | ICD-10-CM | POA: Diagnosis not present

## 2016-12-22 NOTE — Progress Notes (Signed)
[facility]  PATIENT: Melanie Riddle DOB: Feb 24, 1951   REASON FOR VISIT:  follow-up for stroke HISTORY FROM:patient alone at visit    [place] 03/19/2018CM Melanie Riddle, 66 year old female returns for follow-up with history of admission to the hospital September 2017 for stroke event. MRI of the brain nothing acute but remote right cerebellar and left PCA infarcts. She is currently on aspirin for secondary stroke prevention without further stroke or TIA symptoms. She has no bruising and she has no bleeding. She has refused statin therapy but is taking fish oil and red yeast rice. She has refused a 30 day event monitoring She continues to exercise routinely. Blood pressure well controlled in the office today 124/82. She is a vegetarian. She returns for reevaluation  HISTORY 11/20/17CMLael Jonesis a 66 y.o.femalewho presents with left-sided weakness that started around 11:30 PM on 07/03/2016. She has not been to sleep. She states that she has been worried about it, but it has been pretty static since onset. Due to continuing concern, she decided to seek medical care. In the ER, a code stroke was called and she does have an NIH stroke scale of 4, but her deficits are very mild and therefore tPA was not administered. She was admitted for further evaluation and treatment. MRI of the brain no acute stroke but remote right cerebellar and left PCA infarcts. CTA of the head LP 3 severe stenosis LM1 moderate stenosis. CTA of the neck no significant stenosis. 2-D echo 60-65% EF. 30 day event monitoring was recommended however patient has refused. LDL 84. She has also refused statins. Hemoglobin A1c 5.3 she was not on aspirin prior to admission. She returns to the clinic today for stroke follow-up. She is currently on aspirin 0.81 daily. Blood pressure well controlled in the office today at 122/78 she has not had further stroke or TIA symptoms. She plans to get back into her  exercise routine the first of the year. She returns for reevaluation    REVIEW OF SYSTEMS: Full 14 system review of systems performed and notable only for those listed, all others are neg:  Constitutional: neg  Cardiovascular: neg Ear/Nose/Throat: neg  Skin: neg Eyes: neg Respiratory: neg Gastroitestinal: neg  Hematology/Lymphatic: Easy bruising Endocrine: neg Musculoskeletal:neg Allergy/Immunology: neg Neurological: neg Psychiatric: neg Sleep : neg   ALLERGIES: Allergies  Allergen Reactions  . Penicillins Swelling    HOME MEDICATIONS: Outpatient Medications Prior to Visit  Medication Sig Dispense Refill  . aspirin 81 MG tablet Take 4 tablets (325 mg total) by mouth daily. 30 tablet 11  . hydroxypropyl methylcellulose / hypromellose (ISOPTO TEARS / GONIOVISC) 2.5 % ophthalmic solution Place 1 drop into both eyes 3 (three) times daily as needed for dry eyes.    . Multiple Vitamin (MULTIVITAMIN WITH MINERALS) TABS tablet Take 1 tablet by mouth daily.    . Probiotic Product (PROBIOTIC PO) Take 1 tablet by mouth daily.    . vitamin C (ASCORBIC ACID) 500 MG tablet Take 1,000 mg by mouth daily.     No facility-administered medications prior to visit.     PAST MEDICAL HISTORY: Past Medical History:  Diagnosis Date  . Allergy   . Arthritis of hand 04/07/2012  . Atrial fibrillation ([place]) 09/28/2014  . Decreased visual acuity 08/10/2015  . Ischemic optic neuropathy of right eye 03/17/2012   Asked for optometry note.   . Paroxysmal atrial fibrillation ([place]) 06/28/14   single episodes occured during recovery post colonoscopy  . Seasonal allergies 03/17/2012  .  Sickle cell trait ([place])   . Unspecified viral hepatitis C without hepatic coma 08/13/2015    PAST SURGICAL HISTORY: Past Surgical History:  Procedure Laterality Date  . CESAREAN SECTION    . SALPINGECTOMY Right 2005   Dr Melanie Riddle  . TUMOR EXCISION Right    benign right thigh, [place] [facility]    FAMILY HISTORY: Family  History  Problem Relation Age of Onset  . Kidney disease Father   . Sickle cell anemia Brother   . Hyperlipidemia Brother   . Stroke Brother   . Cancer Maternal Aunt     lung cancer  . Colon cancer Neg Hx     SOCIAL HISTORY: Social History   Social History  . Marital status: Divorced    Spouse name: N/A  . Number of children: N/A  . Years of education: N/A   Occupational History  . Not on file.   Social History Main Topics  . Smoking status: Former Research scientist (life sciences)  . Smokeless tobacco: Never Used  . Alcohol use 0.0 oz/week     Comment: wine 3-7 per week  . Drug use: No  . Sexual activity: Yes    Partners: Male     Comment: pt declined condoms   Other Topics Concern  . Not on file   Social History Narrative   Health Risk Assessment      Behavioral Risks   Exercise   Greater than 20 minutes/day for greater than 3 days/week: yes, some of the time      [place] with your teeth or dentures: no      Alcohol Use   4 or more alcoholic drinks in a day in the last year: no      Motor Vehicle Safety   Difficulty driving car: no   Seatbelt usage: yes      Medication Adherence   Trouble taking medicines as directed: I don't take medicines      Psychosocial Risks      Loneliness / Social Isolation      Living alone: yes      Someone available to help or talk:yes      Recent limitation of social activity: not at [place] last 4 weeks: excellent      Home safety      Working smoke alarm: yes      Home throw rugs: yes      Non-slip mats in shower or bathtub: yes      Railings on home stairs: Not applicable      Home free from clutter: yes      Persons helping take care of patient at home: No one.          Emergency contact person(s):      Name      Relationship        Contact phone number       Melanie Riddle                    Daughter                          [phone]      PHYSICAL  EXAM  Vitals:   08/25/16 0923  BP: 122/78  Pulse: 87  Weight: 156 lb 9.6 oz (71 kg)  Height: 5\' 2"  (1.575 m)   Body mass index is 29.04 kg/m.  Generalized: Well developed, in no acute distress  Head: normocephalic and atraumatic,. Oropharynx benign  Neck: Supple, no carotid bruits  Cardiac: Regular rate rhythm, no murmur  Musculoskeletal: No deformity   Neurological examination   Mentation: Alert oriented to time, place, history taking. Attention span and concentration appropriate. Recent and remote memory intact.  Follows all commands speech and language fluent.   Cranial nerve II-XII: Pupils were equal round reactive to light extraocular movements were full, visual field were full on confrontational test. Facial sensation and strength were normal. hearing was intact to finger rubbing bilaterally. Uvula tongue midline. head turning and shoulder shrug were normal and symmetric.Tongue protrusion into cheek strength was normal. Motor: normal bulk and tone, full strength in the BUE, BLE, fine finger movements normal, no pronator drift.  Sensory: normal and symmetric to light touch, pinprick, and  Vibration, In the upper and lower extremities Coordination: finger-nose-finger, heel-to-shin bilaterally, no dysmetria Reflexes: 1+ upper lower and symmetric plantar responses were flexor bilaterally. Gait and Station: Rising up from seated position without assistance, normal stance,  moderate stride, good arm swing, smooth turning, able to perform tiptoe, and heel walking without difficulty. Tandem gait is steady  DIAGNOSTIC DATA (LABS, IMAGING, TESTING) - I reviewed patient records, labs, notes, testing and imaging myself where available.  Lab Results  Component Value Date   WBC 8.6 09/22/2016   HGB 13.7 09/22/2016   HCT 40.9 09/22/2016   MCV 91.5 09/22/2016   PLT 233 09/22/2016      Component Value Date/Time   NA 141 09/22/2016 1539   K 4.2 09/22/2016 1539   CL 105 09/22/2016 1539    CO2 27 09/22/2016 1539   GLUCOSE 96 09/22/2016 1539   BUN 17 09/22/2016 1539   CREATININE 0.78 09/22/2016 1539   CALCIUM 9.6 09/22/2016 1539   PROT 7.1 07/03/2016 0229   ALBUMIN 3.7 07/03/2016 0229   AST 23 07/03/2016 0229   ALT 16 07/03/2016 0229   ALKPHOS 83 07/03/2016 0229   BILITOT 0.8 07/03/2016 0229   GFRNONAA 79 09/22/2016 1539   GFRAA >89 09/22/2016 1539   Lab Results  Component Value Date   CHOL 146 07/03/2016   HDL 45 07/03/2016   LDLCALC 84 07/03/2016   LDLDIRECT 97 04/07/2012   TRIG 83 07/03/2016   CHOLHDL 3.2 07/03/2016   Lab Results  Component Value Date   HGBA1C 5.3 07/04/2016   Lab Results  Component Value Date   VITAMINB12 1,226 (H) 09/22/2016   Lab Results  Component Value Date   TSH 0.57 09/22/2016      ASSESSMENT AND PLAN  66 y.o. year old female  has a past medical history of  Atrial fibrillation ([place]) (09/28/2014);   here To follow-up for right brain TIA etiology unclear. MRI did not show acute stroke however evidence of remote right cerebellar and left PCA strokes. Patient refuses to be on statin drug. She refuses to have cardiac event monitoring she has refused anticoagulation for her atrial fibrillation.Marland Kitchen      PLAN Stressed the importance of continued management of risk factors to prevent further stroke Continue aspirin for secondary stroke prevention Maintain strict control of hypertension with blood pressure goal below 130/90, today's reading 124/82 Cholesterol with LDL cholesterol less than 70, followed by primary care,  most recent 84 has refused  statin drugs, currently on fish oil and Red yeast rice Exercise by walking,  eat healthy diet with whole grains,  fresh fruits and vegetables F/U in 6 months if stable  will dismiss I spent 15 min in total face to face time with the patient more than 50% of which was spent counseling and coordination of care, reviewing test results reviewing medications and discussing and reviewing the  diagnosis of TIA  and continued management of risk factors. I am concerned that she does not want to be on a statin, and that she has refused 30 day event monitoring. She has a history of atrial fibrillation in the past currently not being treated Dennie Bible, [facility], [facility], APRN  [facility] [address], [place], [place] [address] [phone]

## 2016-12-22 NOTE — Patient Instructions (Signed)
Stressed the importance of management of risk factors to prevent further stroke Continue aspirin for secondary stroke prevention Maintain strict control of hypertension with blood pressure goal below 130/90, today's reading 124/82 Cholesterol with LDL cholesterol less than 70, followed by primary care,  most recent 84 has refused  statin drugs Exercise by walking,  eat healthy diet with whole grains,  fresh fruits and vegetables F/U in 6 months

## 2016-12-22 NOTE — Progress Notes (Signed)
I reviewed above note and agree with the assessment and plan.  I agree to discharge her from clinic next time if she continues to refuse our medical advices.   Rosalin Hawking, MD PhD Stroke Neurology 12/22/2016 5:53 PM

## 2016-12-23 ENCOUNTER — Ambulatory Visit (INDEPENDENT_AMBULATORY_CARE_PROVIDER_SITE_OTHER): Payer: Medicare Other | Admitting: Internal Medicine

## 2016-12-23 DIAGNOSIS — K7400 Hepatic fibrosis, unspecified: Secondary | ICD-10-CM

## 2016-12-23 DIAGNOSIS — B182 Chronic viral hepatitis C: Secondary | ICD-10-CM

## 2016-12-23 NOTE — Progress Notes (Signed)
   Subjective:    Patient ID: Melanie Riddle, female    DOB: [birth year], 66 y.o.   MRN: [id number]  HPI Here for follow up of HCV  Completed treatment about 6 months ago with [facility].  End of treatment lab undetectable and now SVR 24 undetectable.  Pleased with the results. F2/3 on elastography.  Has more energy now that hcv is gone.   Review of Systems  Constitutional: Negative for fatigue.  Skin: Negative for rash.       Objective:   Physical Exam  Constitutional: She appears well-developed and well-nourished. No distress.  Eyes: No scleral icterus.  Cardiovascular: Normal rate, regular rhythm and normal heart sounds.   Skin: No rash noted.          Assessment & Plan:

## 2016-12-23 NOTE — Assessment & Plan Note (Signed)
Moderate.  No indication for HCC screening 

## 2016-12-23 NOTE — Assessment & Plan Note (Addendum)
Now considered cured.  Pleased with results.  Discussed that she will always be Ab positive.   Rtc PRN

## 2017-02-03 ENCOUNTER — Encounter: Payer: Self-pay | Admitting: *Deleted

## 2017-02-03 ENCOUNTER — Ambulatory Visit (INDEPENDENT_AMBULATORY_CARE_PROVIDER_SITE_OTHER): Payer: Medicare Other | Admitting: *Deleted

## 2017-02-03 VITALS — BP 138/78 | HR 73 | Temp 98.0°F | Ht 62.0 in | Wt 158.2 lb

## 2017-02-03 DIAGNOSIS — Z0000 Encounter for general adult medical examination without abnormal findings: Secondary | ICD-10-CM

## 2017-02-03 NOTE — Patient Instructions (Signed)
Fall Prevention in the Home Falls can cause injuries. They can happen to people of all ages. There are many things you can do to make your home safe and to help prevent falls. What can I do on the outside of my home?  Regularly fix the edges of walkways and driveways and fix any cracks.  Remove anything that might make you trip as you walk through a door, such as a raised step or threshold.  Trim any bushes or trees on the path to your home.  Use bright outdoor lighting.  Clear any walking paths of anything that might make someone trip, such as rocks or tools.  Regularly check to see if handrails are loose or broken. Make sure that both sides of any steps have handrails.  Any raised decks and porches should have guardrails on the edges.  Have any leaves, snow, or ice cleared regularly.  Use sand or salt on walking paths during winter.  Clean up any spills in your garage right away. This includes oil or grease spills. What can I do in the bathroom?  Use night lights.  Install grab bars by the toilet and in the tub and shower. Do not use towel bars as grab bars.  Use non-skid mats or decals in the tub or shower.  If you need to sit down in the shower, use a plastic, non-slip stool.  Keep the floor dry. Clean up any water that spills on the floor as soon as it happens.  Remove soap buildup in the tub or shower regularly.  Attach bath mats securely with double-sided non-slip rug tape.  Do not have throw rugs and other things on the floor that can make you trip. What can I do in the bedroom?  Use night lights.  Make sure that you have a light by your bed that is easy to reach.  Do not use any sheets or blankets that are too big for your bed. They should not hang down onto the floor.  Have a firm chair that has side arms. You can use this for support while you get dressed.  Do not have throw rugs and other things on the floor that can make you trip. What can I do in  the kitchen?  Clean up any spills right away.  Avoid walking on wet floors.  Keep items that you use a lot in easy-to-reach places.  If you need to reach something above you, use a strong step stool that has a grab bar.  Keep electrical cords out of the way.  Do not use floor polish or wax that makes floors slippery. If you must use wax, use non-skid floor wax.  Do not have throw rugs and other things on the floor that can make you trip. What can I do with my stairs?  Do not leave any items on the stairs.  Make sure that there are handrails on both sides of the stairs and use them. Fix handrails that are broken or loose. Make sure that handrails are as long as the stairways.  Check any carpeting to make sure that it is firmly attached to the stairs. Fix any carpet that is loose or worn.  Avoid having throw rugs at the top or bottom of the stairs. If you do have throw rugs, attach them to the floor with carpet tape.  Make sure that you have a light switch at the top of the stairs and the bottom of the stairs. If you  do not have them, ask someone to add them for you. What else can I do to help prevent falls?  Wear shoes that:  Do not have high heels.  Have rubber bottoms.  Are comfortable and fit you well.  Are closed at the toe. Do not wear sandals.  If you use a stepladder:  Make sure that it is fully opened. Do not climb a closed stepladder.  Make sure that both sides of the stepladder are locked into place.  Ask someone to hold it for you, if possible.  Clearly mark and make sure that you can see:  Any grab bars or handrails.  First and last steps.  Where the edge of each step is.  Use tools that help you move around (mobility aids) if they are needed. These include:  Canes.  Walkers.  Scooters.  Crutches.  Turn on the lights when you go into a dark area. Replace any light bulbs as soon as they burn out.  Set up your furniture so you have a clear  path. Avoid moving your furniture around.  If any of your floors are uneven, fix them.  If there are any pets around you, be aware of where they are.  Review your medicines with your doctor. Some medicines can make you feel dizzy. This can increase your chance of falling. Ask your doctor what other things that you can do to help prevent falls. This information is not intended to replace advice given to you by your health care provider. Make sure you discuss any questions you have with your health care provider. Document Released: 07/19/2009 Document Revised: 02/28/2016 Document Reviewed: 10/27/2014 Elsevier Interactive Patient Education  2017 [place] Maintenance, Female Adopting a healthy lifestyle and getting preventive care can go a long way to promote health and wellness. Talk with your health care provider about what schedule of regular examinations is right for you. This is a good chance for you to check in with your provider about disease prevention and staying healthy. In between checkups, there are plenty of things you can do on your own. Experts have done a lot of research about which lifestyle changes and preventive measures are most likely to keep you healthy. Ask your health care provider for more information. Weight and diet Eat a healthy diet  Be sure to include plenty of vegetables, fruits, low-fat dairy products, and lean protein.  Do not eat a lot of foods high in solid fats, added sugars, or salt.  Get regular exercise. This is one of the most important things you can do for your health.  Most adults should exercise for at least 150 minutes each week. The exercise should increase your heart rate and make you sweat (moderate-intensity exercise).  Most adults should also do strengthening exercises at least twice a week. This is in addition to the moderate-intensity exercise. Maintain a healthy weight  Body mass index (BMI) is a measurement that can be used  to identify possible weight problems. It estimates body fat based on height and weight. Your health care provider can help determine your BMI and help you achieve or maintain a healthy weight.  For females 45 years of age and older:  A BMI below 18.5 is considered underweight.  A BMI of 18.5 to 24.9 is normal.  A BMI of 25 to 29.9 is considered overweight.  A BMI of 30 and above is considered obese. Watch levels of cholesterol and blood lipids  You should start having  your blood tested for lipids and cholesterol at 68 years of age, then have this test every 5 years.  You may need to have your cholesterol levels checked more often if:  Your lipid or cholesterol levels are high.  You are older than 68 years of age.  You are at high risk for heart disease. Cancer screening Lung Cancer  Lung cancer screening is recommended for adults 55-80 years old who are at high risk for lung cancer because of a history of smoking.  A yearly low-dose CT scan of the lungs is recommended for people who:  Currently smoke.  Have quit within the past 15 years.  Have at least a 30-pack-year history of smoking. A pack year is smoking an average of one pack of cigarettes a day for 1 year.  Yearly screening should continue until it has been 15 years since you quit.  Yearly screening should stop if you develop a health problem that would prevent you from having lung cancer treatment. Breast Cancer  Practice breast self-awareness. This means understanding how your breasts normally appear and feel.  It also means doing regular breast self-exams. Let your health care provider know about any changes, no matter how small.  If you are in your 20s or 30s, you should have a clinical breast exam (CBE) by a health care provider every 1-3 years as part of a regular health exam.  If you are 40 or older, have a CBE every year. Also consider having a breast X-ray (mammogram) every year.  If you have a family  history of breast cancer, talk to your health care provider about genetic screening.  If you are at high risk for breast cancer, talk to your health care provider about having an MRI and a mammogram every year.  Breast cancer gene (BRCA) assessment is recommended for women who have family members with BRCA-related cancers. BRCA-related cancers include:  Breast.  Ovarian.  Tubal.  Peritoneal cancers.  Results of the assessment will determine the need for genetic counseling and BRCA1 and BRCA2 testing. Cervical Cancer  Your health care provider may recommend that you be screened regularly for cancer of the pelvic organs (ovaries, uterus, and vagina). This screening involves a pelvic examination, including checking for microscopic changes to the surface of your cervix (Pap test). You may be encouraged to have this screening done every 3 years, beginning at age 21.  For women ages 30-65, health care providers may recommend pelvic exams and Pap testing every 3 years, or they may recommend the Pap and pelvic exam, combined with testing for human papilloma virus (HPV), every 5 years. Some types of HPV increase your risk of cervical cancer. Testing for HPV may also be done on women of any age with unclear Pap test results.  Other health care providers may not recommend any screening for nonpregnant women who are considered low risk for pelvic cancer and who do not have symptoms. Ask your health care provider if a screening pelvic exam is right for you.  If you have had past treatment for cervical cancer or a condition that could lead to cancer, you need Pap tests and screening for cancer for at least 20 years after your treatment. If Pap tests have been discontinued, your risk factors (such as having a new sexual partner) need to be reassessed to determine if screening should resume. Some women have medical problems that increase the chance of getting cervical cancer. In these cases, your health care  provider may   recommend more frequent screening and Pap tests. Colorectal Cancer  This type of cancer can be detected and often prevented.  Routine colorectal cancer screening usually begins at 68 years of age and continues through 68 years of age.  Your health care provider may recommend screening at an earlier age if you have risk factors for colon cancer.  Your health care provider may also recommend using home test kits to check for hidden blood in the stool.  A small camera at the end of a tube can be used to examine your colon directly (sigmoidoscopy or colonoscopy). This is done to check for the earliest forms of colorectal cancer.  Routine screening usually begins at age 50.  Direct examination of the colon should be repeated every 5-10 years through 68 years of age. However, you may need to be screened more often if early forms of precancerous polyps or small growths are found. Skin Cancer  Check your skin from head to toe regularly.  Tell your health care provider about any new moles or changes in moles, especially if there is a change in a mole's shape or color.  Also tell your health care provider if you have a mole that is larger than the size of a pencil eraser.  Always use sunscreen. Apply sunscreen liberally and repeatedly throughout the day.  Protect yourself by wearing long sleeves, pants, a wide-brimmed hat, and sunglasses whenever you are outside. Heart disease, diabetes, and high blood pressure  High blood pressure causes heart disease and increases the risk of stroke. High blood pressure is more likely to develop in:  People who have blood pressure in the high end of the normal range (130-139/85-89 mm Hg).  People who are overweight or obese.  People who are African American.  If you are 18-39 years of age, have your blood pressure checked every 3-5 years. If you are 40 years of age or older, have your blood pressure checked every year. You should have your  blood pressure measured twice-once when you are at a hospital or clinic, and once when you are not at a hospital or clinic. Record the average of the two measurements. To check your blood pressure when you are not at a hospital or clinic, you can use:  An automated blood pressure machine at a pharmacy.  A home blood pressure monitor.  If you are between 55 years and 79 years old, ask your health care provider if you should take aspirin to prevent strokes.  Have regular diabetes screenings. This involves taking a blood sample to check your fasting blood sugar level.  If you are at a normal weight and have a low risk for diabetes, have this test once every three years after 68 years of age.  If you are overweight and have a high risk for diabetes, consider being tested at a younger age or more often. Preventing infection Hepatitis B  If you have a higher risk for hepatitis B, you should be screened for this virus. You are considered at high risk for hepatitis B if:  You were born in a country where hepatitis B is common. Ask your health care provider which countries are considered high risk.  Your parents were born in a high-risk country, and you have not been immunized against hepatitis B (hepatitis B vaccine).  You have HIV or AIDS.  You use needles to inject street drugs.  You live with someone who has hepatitis B.  You have had sex with someone who   has hepatitis B.  You get hemodialysis treatment.  You take certain medicines for conditions, including cancer, organ transplantation, and autoimmune conditions. Hepatitis C  Blood testing is recommended for:  Everyone born from [birth year] through 1967.  Anyone with known risk factors for hepatitis C. Sexually transmitted infections (STIs)  You should be screened for sexually transmitted infections (STIs) including gonorrhea and chlamydia if:  You are sexually active and are younger than 68 years of age.  You are older than 68  years of age and your health care provider tells you that you are at risk for this type of infection.  Your sexual activity has changed since you were last screened and you are at an increased risk for chlamydia or gonorrhea. Ask your health care provider if you are at risk.  If you do not have HIV, but are at risk, it may be recommended that you take a prescription medicine daily to prevent HIV infection. This is called pre-exposure prophylaxis (PrEP). You are considered at risk if:  You are sexually active and do not regularly use condoms or know the HIV status of your partner(s).  You take drugs by injection.  You are sexually active with a partner who has HIV. Talk with your health care provider about whether you are at high risk of being infected with HIV. If you choose to begin PrEP, you should first be tested for HIV. You should then be tested every 3 months for as long as you are taking PrEP. Pregnancy  If you are premenopausal and you may become pregnant, ask your health care provider about preconception counseling.  If you may become pregnant, take 400 to 800 micrograms (mcg) of folic acid every day.  If you want to prevent pregnancy, talk to your health care provider about birth control (contraception). Osteoporosis and menopause  Osteoporosis is a disease in which the bones lose minerals and strength with aging. This can result in serious bone fractures. Your risk for osteoporosis can be identified using a bone density scan.  If you are 64 years of age or older, or if you are at risk for osteoporosis and fractures, ask your health care provider if you should be screened.  Ask your health care provider whether you should take a calcium or vitamin D supplement to lower your risk for osteoporosis.  Menopause may have certain physical symptoms and risks.  Hormone replacement therapy may reduce some of these symptoms and risks. Talk to your health care provider about whether  hormone replacement therapy is right for you. Follow these instructions at home:  Schedule regular health, dental, and eye exams.  Stay current with your immunizations.  Do not use any tobacco products including cigarettes, chewing tobacco, or electronic cigarettes.  If you are pregnant, do not drink alcohol.  If you are breastfeeding, limit how much and how often you drink alcohol.  Limit alcohol intake to no more than 1 drink per day for nonpregnant women. One drink equals 12 ounces of beer, 5 ounces of wine, or 1 ounces of hard liquor.  Do not use street drugs.  Do not share needles.  Ask your health care provider for help if you need support or information about quitting drugs.  Tell your health care provider if you often feel depressed.  Tell your health care provider if you have ever been abused or do not feel safe at home. This information is not intended to replace advice given to you by your health care provider. Make  sure you discuss any questions you have with your health care provider. Document Released: 04/07/2011 Document Revised: 02/28/2016 Document Reviewed: 06/26/2015 Elsevier Interactive Patient Education  2017 [employer]   Hearing Loss Hearing loss is a partial or total loss of the ability to hear. This can be temporary or permanent, and it can happen in one or both ears. Hearing loss may be referred to as deafness. Medical care is necessary to treat hearing loss properly and to prevent the condition from getting worse. Your hearing may partially or completely come back, depending on what caused your hearing loss and how severe it is. In some cases, hearing loss is permanent. What are the causes? Common causes of hearing loss include:  Too much wax in the ear canal.  Infection of the ear canal or middle ear.  Fluid in the middle ear.  Injury to the ear or surrounding area.  An object stuck in the ear.  Prolonged exposure to loud sounds, such as  music. Less common causes of hearing loss include:  Tumors in the ear.  Viral or bacterial infections, such as meningitis.  A hole in the eardrum (perforated eardrum).  Problems with the hearing nerve that sends signals between the brain and the ear.  Certain medicines. What are the signs or symptoms? Symptoms of this condition may include:  Difficulty telling the difference between sounds.  Difficulty following a conversation when there is background noise.  Lack of response to sounds in your environment. This may be most noticeable when you do not respond to startling sounds.  Needing to turn up the volume on the television, radio, etc.  Ringing in the ears.  Dizziness.  Pain in the ears. How is this diagnosed? This condition is diagnosed based on a physical exam and a hearing test (audiometry). The audiometry test will be performed by a hearing specialist (audiologist). You may also be referred to an ear, nose, and throat (ENT) specialist (otolaryngologist). How is this treated? Treatment for recent onset of hearing loss may include:  Ear wax removal.  Being prescribed medicines to prevent infection (antibiotics).  Being prescribed medicines to reduce inflammation (corticosteroids). Follow these instructions at home:  If you were prescribed an antibiotic medicine, take it as told by your health care provider. Do not stop taking the antibiotic even if you start to feel better.  Take over-the-counter and prescription medicines only as told by your health care provider.  Avoid loud noises.  Return to your normal activities as told by your health care provider. Ask your health care provider what activities are safe for you.  Keep all follow-up visits as told by your health care provider. This is important. Contact a health care provider if:  You feel dizzy.  You develop new symptoms.  You vomit or feel nauseous.  You have a fever. Get help right away  if:  You develop sudden changes in your vision.  You have severe ear pain.  You have new or increased weakness.  You have a severe headache. This information is not intended to replace advice given to you by your health care provider. Make sure you discuss any questions you have with your health care provider. Document Released: 09/22/2005 Document Revised: 02/28/2016 Document Reviewed: 02/07/2015 Elsevier Interactive Patient Education  2017 [employer].

## 2017-02-03 NOTE — Progress Notes (Signed)
Addendum: I have reviewed this visit and discussed with Lauren Ducatte, RN, BSN, and agree with her documentation 

## 2017-02-03 NOTE — Progress Notes (Signed)
Subjective:   Melanie Riddle is a 66 y.o. female who presents for Medicare Annual (Subsequent) preventive examination.  Cardiac Risk Factors include: advanced age (>23men, >53 women)     Objective:     Vitals: BP (!) 150/90 (BP Location: Left Arm, Patient Position: Sitting, Cuff Size: Normal)   Pulse 73   Temp 98 F (36.7 C) (Oral)   Ht 5\' 2"  (1.575 m)   Wt 158 lb 3.2 oz (71.8 kg)   SpO2 99%   BMI 28.94 kg/m   Body mass index is 28.94 kg/m. BP recheck 138/78  left arm manually with adult cuff Patient states she has white coat syndrome  Tobacco History  Smoking Status  . Former Smoker  . Types: Cigarettes  . Quit date: 10/06/1969  Smokeless Tobacco  . Never Used     Counseling given: Yes Patient is former smoker with no plans to restart   Past Medical History:  Diagnosis Date  . Allergy   . Arthritis of hand 04/07/2012  . Atrial fibrillation ([place]) 09/28/2014  . Decreased visual acuity 08/10/2015  . Ischemic optic neuropathy of right eye 03/17/2012   Asked for optometry note.   . Paroxysmal atrial fibrillation ([place]) 06/28/14   single episodes occured during recovery post colonoscopy  . Seasonal allergies 03/17/2012  . Sickle cell trait ([place])   . Unspecified viral hepatitis C without hepatic coma 08/13/2015   Past Surgical History:  Procedure Laterality Date  . CESAREAN SECTION    . SALPINGECTOMY Right 2005   Dr Ronnald Ramp  . TUMOR EXCISION Right    benign right thigh, [place] FLA   Family History  Problem Relation Age of Onset  . Kidney disease Father   . Alcohol abuse Father   . Hypertension Mother   . GER disease Mother   . Stroke Brother   . Cancer Maternal Aunt     lung cancer  . Hypertension Sister   . Hypertension Daughter   . Sickle cell anemia Brother   . Sickle cell anemia Brother   . Colon cancer Neg Hx    History  Sexual Activity  . Sexual activity: Yes  . Partners: Male    Comment: pt declined condoms    Outpatient Encounter Prescriptions  as of 02/03/2017  Medication Sig  . aspirin 81 MG tablet Take 4 tablets (325 mg total) by mouth daily.  . hydroxypropyl methylcellulose / hypromellose (ISOPTO TEARS / GONIOVISC) 2.5 % ophthalmic solution Place 1 drop into both eyes 3 (three) times daily as needed for dry eyes.  . Multiple Vitamin (MULTIVITAMIN WITH MINERALS) TABS tablet Take 1 tablet by mouth daily.  . Omega-3 Fatty Acids (FISH OIL) 1200 MG CAPS Take by mouth.  . Red Yeast Rice 600 MG CAPS Take by mouth.  . vitamin C (ASCORBIC ACID) 500 MG tablet Take 1,000 mg by mouth daily.  . Probiotic Product (PROBIOTIC PO) Take 1 tablet by mouth daily.   No facility-administered encounter medications on file as of 02/03/2017.     Activities of Daily Living In your present state of health, do you have any difficulty performing the following activities: 02/03/2017 12/23/2016  Hearing? Y N  Vision? N N  Difficulty concentrating or making decisions? N N  Walking or climbing stairs? N N  Dressing or bathing? N N  Doing errands, shopping? N N  Preparing Food and eating ? N -  Using the Toilet? N -  In the past six months, have you accidently leaked urine?  N -  Do you have problems with loss of bowel control? N -  Managing your Medications? N -  Managing your Finances? N -  Housekeeping or managing your Housekeeping? N -  Some recent data might be hidden   Home Safety:  My home has a working smoke alarm:  Yes  1           My home throw rugs have been fastened down to the floor or removed:  Non-slip backs I have non-slip mats in the bathtub and shower:  Yes         All my home's stairs have railings or bannisters: One level home with no outside steps   My home's floors, stairs and hallways are free from clutter, wires and cords:  Yes     I wear seatbelts consistently:  Yes   Patient Care Team: Vivi Barrack, MD as PCP - General (Family Medicine)  [facility] Assessment:     Exercise Activities and Dietary  recommendations Current Exercise Habits: Structured exercise class (zumba), Type of exercise: walking, Time (Minutes): 60, Frequency (Times/Week): 1, Weekly Exercise (Minutes/Week): 60, Intensity: Moderate  Goals    . Blood Pressure < 140/90    . Exercise 3x per week (90 min per time) (pt-stated)          [place]    . Weight < 147 lb (66.679 kg)          Would like to lose 10-15 pounds.  Exercising at gym.  Avoiding sugar in diet.       Fall Risk Fall Risk  02/03/2017 12/23/2016 07/14/2016 06/17/2016 03/05/2016  Falls in the past year? No No No No No   Depression Screen PHQ 2/9 Scores 02/03/2017 12/23/2016 09/22/2016 07/14/2016  PHQ - 2 Score 0 0 0 0    TUG Test:  Done in 7 seconds.   Cognitive Function: Mini-Cog  Passed with score 5/5  Cognitive Function MMSE - Mini Mental State Exam 08/10/2015  Not completed: (No Data)        Immunization History  Administered Date(s) Administered  . Hepatitis B, adult 03/05/2016, 06/17/2016  . Influenza Split 09/13/2012  . Influenza,inj,Quad PF,36+ Mos 07/13/2013, 07/12/2014, 08/09/2015  . Influenza-Unspecified 08/20/2016  . Pneumococcal Conjugate-13 08/03/2014  . Pneumococcal Polysaccharide-23 08/09/2015  . Tdap 12/31/2015  . Zoster 12/03/2015   Screening Tests Health Maintenance  Topic Date Due  . INFLUENZA VACCINE  05/06/2017  . MAMMOGRAM  10/21/2018  . COLONOSCOPY  09/27/2024  . TETANUS/TDAP  12/30/2025  . DEXA SCAN  Completed  . Hepatitis C Screening  Completed  . PNA vac Low Risk Adult  Completed      Plan:      I have personally reviewed and noted the following in the patient's chart:   . Medical and social history . Use of alcohol, tobacco or illicit drugs  . Current medications and supplements . Functional ability and status . Nutritional status . Physical activity . Advanced directives . List of other physicians . Vitals . Screenings to include cognitive, depression, and falls . Referrals  and appointments  In addition, I have reviewed and discussed with patient certain preventive protocols, quality metrics, and best practice recommendations. A written personalized care plan for preventive services as well as general preventive health recommendations were provided to patient.     Velora Heckler, RN  02/03/2017

## 2017-06-24 ENCOUNTER — Telehealth: Payer: Self-pay | Admitting: Family Medicine

## 2017-06-24 NOTE — Telephone Encounter (Signed)
Pt contacted and scheduled with pcp for vision changes.

## 2017-06-24 NOTE — Telephone Encounter (Signed)
Is having problems with visio. Would like to  see an eye dr. Aldean Jewett advise

## 2017-06-29 ENCOUNTER — Ambulatory Visit: Payer: Medicare Other | Admitting: Family Medicine

## 2017-06-29 ENCOUNTER — Ambulatory Visit: Payer: Medicare Other | Admitting: Nurse Practitioner

## 2017-06-30 ENCOUNTER — Encounter: Payer: Self-pay | Admitting: Family Medicine

## 2017-06-30 ENCOUNTER — Ambulatory Visit (INDEPENDENT_AMBULATORY_CARE_PROVIDER_SITE_OTHER): Payer: Medicare Other | Admitting: *Deleted

## 2017-06-30 ENCOUNTER — Ambulatory Visit (INDEPENDENT_AMBULATORY_CARE_PROVIDER_SITE_OTHER): Payer: Medicare Other | Admitting: Family Medicine

## 2017-06-30 VITALS — BP 120/76 | HR 93 | Temp 97.7°F | Wt 161.0 lb

## 2017-06-30 DIAGNOSIS — Z23 Encounter for immunization: Secondary | ICD-10-CM | POA: Diagnosis present

## 2017-06-30 DIAGNOSIS — H539 Unspecified visual disturbance: Secondary | ICD-10-CM

## 2017-06-30 NOTE — Patient Instructions (Signed)
It was great seeing you today! We have addressed the following issues today  1. I made a referral to the eye doctor you will get a call for an appointment 2. I will see you again on a as needed basis for any chronic issue.  If we did any lab work today, and the results require attention, either me or my nurse will get in touch with you. If everything is normal, you will get a letter in mail and a message via . If you don't hear from [place] in two weeks, please give [place] a call. Otherwise, we look forward to seeing you again at your next visit. If you have any questions or concerns before then, please call the clinic at [phone].  Please bring all your medications to every doctors visit  Sign up for My Chart to have easy access to your labs results, and communication with your Primary care physician. Please ask Front Desk for some assistance.   Please check-out at the front desk before leaving the clinic.    Take Care,   Dr. Andy Gauss

## 2017-06-30 NOTE — Progress Notes (Signed)
   Subjective:    Patient ID: Melanie Riddle, female    DOB: 07-Jan-1951, 66 y.o.   MRN: [id number]   CC: Vision changes  HPI: Patient is 66 yo female who presented today complaining of visual change for the past few weeks. Patient reports that vision has been barrier in the past few weeks. She is able to see as long as she is wearing her prescription glasses. Patient has not seen an eye doctor in 2 years. Patient has no significant past medical history that would be concerning for her visual changes. Patient has no acute complaints, has been feeling well and is in her normal health state. She denies any headache or dizziness associated with diffusion change. Patient denies any chest pain, shortness of breath, palpitation, nausea, vomiting, abdominal pain, Smoking status reviewed   ROS: all other systems were reviewed and are negative other than in the HPI   Past Medical History:  Diagnosis Date  . Allergy   . Arthritis of hand 04/07/2012  . Atrial fibrillation ([place]) 09/28/2014  . Decreased visual acuity 08/10/2015  . Ischemic optic neuropathy of right eye 03/17/2012   Asked for optometry note.   . Paroxysmal atrial fibrillation ([place]) 06/28/14   single episodes occured during recovery post colonoscopy  . Seasonal allergies 03/17/2012  . Sickle cell trait ([place])   . Unspecified viral hepatitis C without hepatic coma 08/13/2015    Past Surgical History:  Procedure Laterality Date  . CESAREAN SECTION    . SALPINGECTOMY Right 2005   Dr Ronnald Ramp  . TUMOR EXCISION Right    benign right thigh, [place] FLA    Objective:  BP 120/76   Pulse 93   Temp 97.7 F (36.5 C) (Oral)   Wt 161 lb (73 kg)   SpO2 98%   BMI 29.45 kg/m   Vitals and nursing note reviewed  General: NAD, pleasant, able to participate in exam HEENT: No visual field deficit, EOM intact. Mild reduction in visual acuity. Cardiac: RRR, normal heart sounds, no murmurs. 2+ radial and PT pulses bilaterally Respiratory: CTAB, normal  effort, No wheezes, rales or rhonchi Abdomen: soft, nontender, nondistended, no hepatic or splenomegaly, +BS Extremities: no edema or cyanosis. WWP. Skin: warm and dry, no rashes noted Neuro: alert and oriented x4, no focal deficits Psych: Normal affect and mood   Assessment & Plan:   #Visual changes, chronic, worsening Patient is a healthy 66 year old female who present for recent visual changes likely age-related. No accompanying concerning symptoms. Eye exam is within normal limits with mild decrease in visual acuity when she is not wearing her prescription glasses. Patient could benefit from optometry referral for evaluation and needed adjustment. --Refer [facility] --Follow-up as needed    Marjie Skiff, MD [place] PGY-2

## 2018-06-29 ENCOUNTER — Ambulatory Visit (INDEPENDENT_AMBULATORY_CARE_PROVIDER_SITE_OTHER): Payer: Medicare Other

## 2018-06-29 VITALS — BP 130/86 | HR 95 | Temp 98.3°F | Ht 62.0 in | Wt 158.3 lb

## 2018-06-29 DIAGNOSIS — Z0000 Encounter for general adult medical examination without abnormal findings: Secondary | ICD-10-CM

## 2018-06-29 DIAGNOSIS — Z23 Encounter for immunization: Secondary | ICD-10-CM | POA: Diagnosis not present

## 2018-06-29 NOTE — Progress Notes (Signed)
Subjective:   Melanie Riddle is a 67 y.o. female who presents for Medicare Annual (Subsequent) preventive examination.  Review of Systems:  Physical assessment deferred to PCP.  Cardiac Risk Factors include: advanced age (>30men, >85 women)     Objective:    Vitals: BP 130/86   Pulse 95   Temp 98.3 F (36.8 C) (Oral)   Ht 5\' 2"  (1.575 m)   Wt 158 lb 4.8 oz (71.8 kg)   SpO2 98%   BMI 28.95 kg/m   Body mass index is 28.95 kg/m.  Advanced Directives 06/29/2018 06/30/2017 02/03/2017 09/22/2016 07/03/2016 07/03/2016 12/19/2015  Does Patient Have a Medical Advance Directive? No No No No No No No  Would patient like information on creating a medical advance directive? Yes (MAU/Ambulatory/Procedural Areas - Information given) No - Patient declined Yes (MAU/Ambulatory/Procedural Areas - Information given) No - Patient declined Yes - Educational materials given - No - patient declined information   Tobacco Social History   Tobacco Use  Smoking Status Former Smoker  . Types: Cigarettes  . Last attempt to quit: 10/06/1969  . Years since quitting: 48.7  Smokeless Tobacco Never Used  Tobacco Comment   No plans to re-start     Counseling given: Yes Comment: No plans to re-start  Clinical Intake:  Pre-visit preparation completed: No  Pain : No/denies pain Pain Score: 0-No pain  Nutritional Status: BMI 25-29 overweight Diabetes: No  How often do you need to have someone help you when you read instructions, pamphlets, or other written materials from your doctor or pharmacy?: 1 - Never What is the last grade level you completed in school?: Bachelor's degree  Interpreter Needed?: No    Past Medical History:  Diagnosis Date  . Allergy   . Arthritis of hand 04/07/2012  . Atrial fibrillation ([place]) 09/28/2014  . Decreased visual acuity 08/10/2015  . Ischemic optic neuropathy of right eye 03/17/2012   Asked for optometry note.   . Paroxysmal atrial fibrillation ([place]) 06/28/14   single  episodes occured during recovery post colonoscopy  . Seasonal allergies 03/17/2012  . Sickle cell trait ([place])   . Unspecified viral hepatitis C without hepatic coma 08/13/2015   Past Surgical History:  Procedure Laterality Date  . CESAREAN SECTION    . SALPINGECTOMY Right 2005   Dr Ronnald Ramp  . TUMOR EXCISION Right    benign right thigh, [place] FLA   Family History  Problem Relation Age of Onset  . Kidney disease Father   . Alcohol abuse Father   . Hypertension Mother   . GER disease Mother   . Stroke Brother   . Hypertension Brother   . Cancer Maternal Aunt        lung cancer  . Hypertension Sister   . Hypertension Daughter   . Sickle cell anemia Brother   . Sickle cell anemia Brother   . Colon cancer Neg Hx    Social History   Socioeconomic History  . Marital status: Divorced    Spouse name: Not on file  . Number of children: 2  . Years of education: 16  . Highest education level: Bachelor's degree (e.g., BA, AB, BS)  Occupational History  . Occupation: Retired    Comment: Statistician  Social Needs  . Financial resource strain: Not hard at all  . Food insecurity:    Worry: Never true    Inability: Never true  . Transportation needs:    Medical: No  Non-medical: No  Tobacco Use  . Smoking status: Former Smoker    Types: Cigarettes    Last attempt to quit: 10/06/1969    Years since quitting: 48.7  . Smokeless tobacco: Never Used  . Tobacco comment: No plans to re-start  Substance and Sexual Activity  . Alcohol use: Not Currently    Alcohol/week: 0.0 standard drinks    Frequency: Never  . Drug use: No  . Sexual activity: Yes    Partners: Male    Comment: pt declined condoms  Lifestyle  . Physical activity:    Days per week: 2 days    Minutes per session: 120 min  . Stress: Not at all  Relationships  . Social connections:    Talks on phone: More than three times a week    Gets together: Once a week    Attends religious  service: 1 to 4 times per year    Active member of club or organization: Yes    Attends meetings of clubs or organizations: More than 4 times per year    Relationship status: Divorced  Other Topics Concern  . Not on file  Social History Narrative   Live alone in a senior apt. [employer] [address]. Ground level, hand rails in bathroom, throw rugs attached to floor. No pets. Has a green thumb. Plays ukelele in an orchestra, goes to cultural events. Goes to exercise at senior center twice weekly or walks. Loves the senior center.       Health Risk Assessment      Behavioral Risks   Exercise   Greater than 20 minutes/day for greater than 3 days/week: yes, always   Dental Health   Trouble with your teeth or dentures: no      Alcohol Use   4 or more alcoholic drinks in a day in the last year: no      Motor Vehicle Safety   Difficulty driving car: no   Seatbelt usage: yes      Medication Adherence   Takes some supplements; no prescription medications      Psychosocial Risks      Loneliness / Social Isolation      Living alone: yes      Someone available to help or talk:yes      Recent limitation of social activity: not at [place] last 4 weeks: excellent      Home safety      Working smoke alarm: yes      Home throw rugs: yes      Non-slip mats in shower or bathtub: yes      Railings on home stairs: Not applicable      Home free from clutter: yes      Persons helping take care of patient at home: No one.          Emergency contact person(s):      Name      Relationship        Contact phone number       Anyiah Coverdale                    Daughter                          [phone]     Outpatient Encounter Medications as of 06/29/2018  Medication Sig  . hydroxypropyl methylcellulose / hypromellose (ISOPTO TEARS /  GONIOVISC) 2.5 % ophthalmic solution Place 1 drop into both eyes 3 (three) times daily as needed for dry eyes.  .  Multiple Vitamin (MULTIVITAMIN WITH MINERALS) TABS tablet Take 1 tablet by mouth daily.  . Probiotic Product (PROBIOTIC PO) Take 1 tablet by mouth daily.  Marland Kitchen UNABLE TO FIND CBD oil 500 mg daily  . vitamin C (ASCORBIC ACID) 500 MG tablet Take 1,000 mg by mouth daily.  Marland Kitchen aspirin 81 MG tablet Take 4 tablets (325 mg total) by mouth daily. (Patient not taking: Reported on 06/29/2018)  . Omega-3 Fatty Acids (FISH OIL) 1200 MG CAPS Take by mouth.  . Red Yeast Rice 600 MG CAPS Take by mouth.   No facility-administered encounter medications on file as of 06/29/2018.     Activities of Daily Living In your present state of health, do you have any difficulty performing the following activities: 06/29/2018  Hearing? N  Vision? N  Difficulty concentrating or making decisions? N  Walking or climbing stairs? N  Dressing or bathing? N  Doing errands, shopping? N  Preparing Food and eating ? N  Using the Toilet? N  In the past six months, have you accidently leaked urine? N  Do you have problems with loss of bowel control? N  Managing your Medications? N  Managing your Finances? N  Housekeeping or managing your Housekeeping? N  Some recent data might be hidden   Patient Care Team: Marjie Skiff, MD as PCP - General (Family Medicine)  [facility] on [address] does eye exams.    Assessment:   This is a routine wellness examination for Conni. The patient was informed that the wellness visit is to identify future health risk and educate and initiate measures that can reduce risk for increased disease through the lifespan.   Describes health as fair, good or great? Great   Exercise Activities and Dietary recommendations Current Exercise Habits: Structured exercise class, Type of exercise: yoga;walking;calisthenics, Time (Minutes): 45, Frequency (Times/Week): 3, Weekly Exercise (Minutes/Week): 135, Intensity: Moderate, Exercise limited by: None identified  Goals      Patient Stated   .  Exercise 3x per week (90 min per time) (pt-stated)     [place] classes      Other   . Blood Pressure < 140/90    . Weight < 147 lb (66.679 kg)     Would like to lose 10-15 pounds.  Exercising at senior center 2 days a week; does 3 45 minute classes. Avoiding sugar in diet. Vegan lifestyle.       Fall Risk Fall Risk  06/29/2018 06/30/2017 02/03/2017 12/23/2016 07/14/2016  Falls in the past year? No No No No No   Is the patient's home free of loose throw rugs in walkways, pet beds, electrical cords, etc?   yes      Grab bars in the bathroom? yes      Handrails on the stairs?   yes      Adequate lighting?   yes  Timed Get Up and Go performed: 5 seconds  Depression Screen PHQ 2/9 Scores 06/29/2018 06/30/2017 02/03/2017 12/23/2016  PHQ - 2 Score 0 0 0 0     Cognitive Function MMSE - Mini Mental State Exam 08/10/2015  Not completed: (No Data)     6CIT Screen 06/29/2018  What Year? 0 points  What month? 0 points  What time? 0 points  Count back from 20 0 points  Months in reverse 0 points  Repeat phrase 0 points  Total Score 0    Immunization History  Administered Date(s) Administered  . Hepatitis B, adult 03/05/2016, 06/17/2016  . Influenza Split 09/13/2012  . Influenza,inj,Quad PF,6+ Mos 07/13/2013, 07/12/2014, 08/09/2015, 06/30/2017, 06/29/2018  . Influenza-Unspecified 08/20/2016  . Pneumococcal Conjugate-13 08/03/2014  . Pneumococcal Polysaccharide-23 08/09/2015  . Tdap 12/31/2015  . Zoster 12/03/2015    Screening Tests Health Maintenance  Topic Date Due  . MAMMOGRAM  10/21/2018  . COLONOSCOPY  09/27/2024  . TETANUS/TDAP  12/30/2025  . INFLUENZA VACCINE  Completed  . DEXA SCAN  Completed  . Hepatitis C Screening  Completed  . PNA vac Low Risk Adult  Completed    Cancer Screenings: Lung: Low Dose CT Chest recommended if Age 23-80 years, 30 pack-year currently smoking OR have quit w/in 15years. Patient does not qualify. Breast:  Up to date on Mammogram? Yes    Up to date of Bone Density/Dexa? Yes Colorectal: up to date  Additional Screenings: : Hepatitis C Screening: completed     Plan:    I have personally reviewed and noted the following in the patient's chart:   . Medical and social history . Use of alcohol, tobacco or illicit drugs  . Current medications and supplements . Functional ability and status . Nutritional status . Physical activity . Advanced directives . List of other physicians . Hospitalizations, surgeries, and ER visits in previous 12 months . Vitals . Screenings to include cognitive, depression, and falls . Referrals and appointments  In addition, I have reviewed and discussed with patient certain preventive protocols, quality metrics, and best practice recommendations. A written personalized care plan for preventive services as well as general preventive health recommendations were provided to patient.     Esau Grew, RN  06/29/2018

## 2018-06-29 NOTE — Patient Instructions (Addendum)
Melanie Riddle , Thank you for taking time to come for your Medicare Wellness Visit. I appreciate your ongoing commitment to your health goals. Please review the following plan we discussed and let me know if I can assist you in the future.   These are the goals we discussed: Goals      Patient Stated   . Exercise 3x per week (90 min per time) (pt-stated)     [place] classes      Other   . Blood Pressure < 140/90    . Weight < 147 lb (66.679 kg)     Would like to lose 10-15 pounds.  [facility] center 2 days a week; does 3 45 minute classes. Avoiding sugar in diet. Vegan lifestyle.       This is a list of the screening recommended for you and due dates:  Health Maintenance  Topic Date Due  . Mammogram  10/21/2018  . Colon Cancer Screening  09/27/2024  . Tetanus Vaccine  12/30/2025  . Flu Shot  Completed  . DEXA scan (bone density measurement)  Completed  .  Hepatitis C: One time screening is recommended by Center for Disease Control  (CDC) for  adults born from [birth year] through 1967.   Completed  . Pneumonia vaccines  Completed    Fall Prevention in the Home Falls can cause injuries. They can happen to people of all ages. There are many things you can do to make your home safe and to help prevent falls. What can I do on the outside of my home?  Regularly fix the edges of walkways and driveways and fix any cracks.  Remove anything that might make you trip as you walk through a door, such as a raised step or threshold.  Trim any bushes or trees on the path to your home.  Use bright outdoor lighting.  Clear any walking paths of anything that might make someone trip, such as rocks or tools.  Regularly check to see if handrails are loose or broken. Make sure that both sides of any steps have handrails.  Any raised decks and porches should have guardrails on the edges.  Have any leaves, snow, or ice cleared regularly.  Use sand or salt on walking paths during  winter.  Clean up any spills in your garage right away. This includes oil or grease spills. What can I do in the bathroom?  Use night lights.  Install grab bars by the toilet and in the tub and shower. Do not use towel bars as grab bars.  Use non-skid mats or decals in the tub or shower.  If you need to sit down in the shower, use a plastic, non-slip stool.  Keep the floor dry. Clean up any water that spills on the floor as soon as it happens.  Remove soap buildup in the tub or shower regularly.  Attach bath mats securely with double-sided non-slip rug tape.  Do not have throw rugs and other things on the floor that can make you trip. What can I do in the bedroom?  Use night lights.  Make sure that you have a light by your bed that is easy to reach.  Do not use any sheets or blankets that are too big for your bed. They should not hang down onto the floor.  Have a firm chair that has side arms. You can use this for support while you get dressed.  Do not have throw rugs and other things  on the floor that can make you trip. What can I do in the kitchen?  Clean up any spills right away.  Avoid walking on wet floors.  Keep items that you use a lot in easy-to-reach places.  If you need to reach something above you, use a strong step stool that has a grab bar.  Keep electrical cords out of the way.  Do not use floor polish or wax that makes floors slippery. If you must use wax, use non-skid floor wax.  Do not have throw rugs and other things on the floor that can make you trip. What can I do with my stairs?  Do not leave any items on the stairs.  Make sure that there are handrails on both sides of the stairs and use them. Fix handrails that are broken or loose. Make sure that handrails are as long as the stairways.  Check any carpeting to make sure that it is firmly attached to the stairs. Fix any carpet that is loose or worn.  Avoid having throw rugs at the top or  bottom of the stairs. If you do have throw rugs, attach them to the floor with carpet tape.  Make sure that you have a light switch at the top of the stairs and the bottom of the stairs. If you do not have them, ask someone to add them for you. What else can I do to help prevent falls?  Wear shoes that: ? Do not have high heels. ? Have rubber bottoms. ? Are comfortable and fit you well. ? Are closed at the toe. Do not wear sandals.  If you use a stepladder: ? Make sure that it is fully opened. Do not climb a closed stepladder. ? Make sure that both sides of the stepladder are locked into place. ? Ask someone to hold it for you, if possible.  Clearly mark and make sure that you can see: ? Any grab bars or handrails. ? First and last steps. ? Where the edge of each step is.  Use tools that help you move around (mobility aids) if they are needed. These include: ? Canes. ? Walkers. ? Scooters. ? Crutches.  Turn on the lights when you go into a dark area. Replace any light bulbs as soon as they burn out.  Set up your furniture so you have a clear path. Avoid moving your furniture around.  If any of your floors are uneven, fix them.  If there are any pets around you, be aware of where they are.  Review your medicines with your doctor. Some medicines can make you feel dizzy. This can increase your chance of falling. Ask your doctor what other things that you can do to help prevent falls. This information is not intended to replace advice given to you by your health care provider. Make sure you discuss any questions you have with your health care provider. Document Released: 07/19/2009 Document Revised: 02/28/2016 Document Reviewed: 10/27/2014 Elsevier Interactive Patient Education  2018 [place] Maintenance, Female Adopting a healthy lifestyle and getting preventive care can go a long way to promote health and wellness. Talk with your health care provider about what  schedule of regular examinations is right for you. This is a good chance for you to check in with your provider about disease prevention and staying healthy. In between checkups, there are plenty of things you can do on your own. Experts have done a lot of research about which lifestyle changes  and preventive measures are most likely to keep you healthy. Ask your health care provider for more information. Weight and diet Eat a healthy diet  Be sure to include plenty of vegetables, fruits, low-fat dairy products, and lean protein.  Do not eat a lot of foods high in solid fats, added sugars, or salt.  Get regular exercise. This is one of the most important things you can do for your health. ? Most adults should exercise for at least 150 minutes each week. The exercise should increase your heart rate and make you sweat (moderate-intensity exercise). ? Most adults should also do strengthening exercises at least twice a week. This is in addition to the moderate-intensity exercise.  Maintain a healthy weight  Body mass index (BMI) is a measurement that can be used to identify possible weight problems. It estimates body fat based on height and weight. Your health care provider can help determine your BMI and help you achieve or maintain a healthy weight.  For females 87 years of age and older: ? A BMI below 18.5 is considered underweight. ? A BMI of 18.5 to 24.9 is normal. ? A BMI of 25 to 29.9 is considered overweight. ? A BMI of 30 and above is considered obese.  Watch levels of cholesterol and blood lipids  You should start having your blood tested for lipids and cholesterol at 69 years of age, then have this test every 5 years.  You may need to have your cholesterol levels checked more often if: ? Your lipid or cholesterol levels are high. ? You are older than 70 years of age. ? You are at high risk for heart disease.  Cancer screening Lung Cancer  Lung cancer screening is recommended  for adults 29-41 years old who are at high risk for lung cancer because of a history of smoking.  A yearly low-dose CT scan of the lungs is recommended for people who: ? Currently smoke. ? Have quit within the past 15 years. ? Have at least a 30-pack-year history of smoking. A pack year is smoking an average of one pack of cigarettes a day for 1 year.  Yearly screening should continue until it has been 15 years since you quit.  Yearly screening should stop if you develop a health problem that would prevent you from having lung cancer treatment.  Breast Cancer  Practice breast self-awareness. This means understanding how your breasts normally appear and feel.  It also means doing regular breast self-exams. Let your health care provider know about any changes, no matter how small.  If you are in your 20s or 30s, you should have a clinical breast exam (CBE) by a health care provider every 1-3 years as part of a regular health exam.  If you are 74 or older, have a CBE every year. Also consider having a breast X-ray (mammogram) every year.  If you have a family history of breast cancer, talk to your health care provider about genetic screening.  If you are at high risk for breast cancer, talk to your health care provider about having an MRI and a mammogram every year.  Breast cancer gene (BRCA) assessment is recommended for women who have family members with BRCA-related cancers. BRCA-related cancers include: ? Breast. ? Ovarian. ? Tubal. ? Peritoneal cancers.  Results of the assessment will determine the need for genetic counseling and BRCA1 and BRCA2 testing.  Cervical Cancer Your health care provider may recommend that you be screened regularly for cancer of  the pelvic organs (ovaries, uterus, and vagina). This screening involves a pelvic examination, including checking for microscopic changes to the surface of your cervix (Pap test). You may be encouraged to have this screening done  every 3 years, beginning at age 16.  For women ages 59-65, health care providers may recommend pelvic exams and Pap testing every 3 years, or they may recommend the Pap and pelvic exam, combined with testing for human papilloma virus (HPV), every 5 years. Some types of HPV increase your risk of cervical cancer. Testing for HPV may also be done on women of any age with unclear Pap test results.  Other health care providers may not recommend any screening for nonpregnant women who are considered low risk for pelvic cancer and who do not have symptoms. Ask your health care provider if a screening pelvic exam is right for you.  If you have had past treatment for cervical cancer or a condition that could lead to cancer, you need Pap tests and screening for cancer for at least 20 years after your treatment. If Pap tests have been discontinued, your risk factors (such as having a new sexual partner) need to be reassessed to determine if screening should resume. Some women have medical problems that increase the chance of getting cervical cancer. In these cases, your health care provider may recommend more frequent screening and Pap tests.  Colorectal Cancer  This type of cancer can be detected and often prevented.  Routine colorectal cancer screening usually begins at 70 years of age and continues through 69 years of age.  Your health care provider may recommend screening at an earlier age if you have risk factors for colon cancer.  Your health care provider may also recommend using home test kits to check for hidden blood in the stool.  A small camera at the end of a tube can be used to examine your colon directly (sigmoidoscopy or colonoscopy). This is done to check for the earliest forms of colorectal cancer.  Routine screening usually begins at age 29.  Direct examination of the colon should be repeated every 5-10 years through 69 years of age. However, you may need to be screened more often if  early forms of precancerous polyps or small growths are found.  Skin Cancer  Check your skin from head to toe regularly.  Tell your health care provider about any new moles or changes in moles, especially if there is a change in a mole's shape or color.  Also tell your health care provider if you have a mole that is larger than the size of a pencil eraser.  Always use sunscreen. Apply sunscreen liberally and repeatedly throughout the day.  Protect yourself by wearing long sleeves, pants, a wide-brimmed hat, and sunglasses whenever you are outside.  Heart disease, diabetes, and high blood pressure  High blood pressure causes heart disease and increases the risk of stroke. High blood pressure is more likely to develop in: ? People who have blood pressure in the high end of the normal range (130-139/85-89 mm Hg). ? People who are overweight or obese. ? People who are African American.  If you are 12-42 years of age, have your blood pressure checked every 3-5 years. If you are 34 years of age or older, have your blood pressure checked every year. You should have your blood pressure measured twice-once when you are at a hospital or clinic, and once when you are not at a hospital or clinic. Record the average  of the two measurements. To check your blood pressure when you are not at a hospital or clinic, you can use: ? An automated blood pressure machine at a pharmacy. ? A home blood pressure monitor.  If you are between 80 years and 28 years old, ask your health care provider if you should take aspirin to prevent strokes.  Have regular diabetes screenings. This involves taking a blood sample to check your fasting blood sugar level. ? If you are at a normal weight and have a low risk for diabetes, have this test once every three years after 69 years of age. ? If you are overweight and have a high risk for diabetes, consider being tested at a younger age or more often. Preventing  infection Hepatitis B  If you have a higher risk for hepatitis B, you should be screened for this virus. You are considered at high risk for hepatitis B if: ? You were born in a country where hepatitis B is common. Ask your health care provider which countries are considered high risk. ? Your parents were born in a high-risk country, and you have not been immunized against hepatitis B (hepatitis B vaccine). ? You have HIV or AIDS. ? You use needles to inject street drugs. ? You live with someone who has hepatitis B. ? You have had sex with someone who has hepatitis B. ? You get hemodialysis treatment. ? You take certain medicines for conditions, including cancer, organ transplantation, and autoimmune conditions.  Hepatitis C  Blood testing is recommended for: ? Everyone born from [birth year] through 1965. ? Anyone with known risk factors for hepatitis C.  Sexually transmitted infections (STIs)  You should be screened for sexually transmitted infections (STIs) including gonorrhea and chlamydia if: ? You are sexually active and are younger than 69 years of age. ? You are older than 69 years of age and your health care provider tells you that you are at risk for this type of infection. ? Your sexual activity has changed since you were last screened and you are at an increased risk for chlamydia or gonorrhea. Ask your health care provider if you are at risk.  If you do not have HIV, but are at risk, it may be recommended that you take a prescription medicine daily to prevent HIV infection. This is called pre-exposure prophylaxis (PrEP). You are considered at risk if: ? You are sexually active and do not regularly use condoms or know the HIV status of your partner(s). ? You take drugs by injection. ? You are sexually active with a partner who has HIV.  Talk with your health care provider about whether you are at high risk of being infected with HIV. If you choose to begin PrEP, you should first be  tested for HIV. You should then be tested every 3 months for as long as you are taking PrEP. Pregnancy  If you are premenopausal and you may become pregnant, ask your health care provider about preconception counseling.  If you may become pregnant, take 400 to 800 micrograms (mcg) of folic acid every day.  If you want to prevent pregnancy, talk to your health care provider about birth control (contraception). Osteoporosis and menopause  Osteoporosis is a disease in which the bones lose minerals and strength with aging. This can result in serious bone fractures. Your risk for osteoporosis can be identified using a bone density scan.  If you are 11 years of age or older, or if you are at  risk for osteoporosis and fractures, ask your health care provider if you should be screened.  Ask your health care provider whether you should take a calcium or vitamin D supplement to lower your risk for osteoporosis.  Menopause may have certain physical symptoms and risks.  Hormone replacement therapy may reduce some of these symptoms and risks. Talk to your health care provider about whether hormone replacement therapy is right for you. Follow these instructions at home:  Schedule regular health, dental, and eye exams.  Stay current with your immunizations.  Do not use any tobacco products including cigarettes, chewing tobacco, or electronic cigarettes.  If you are pregnant, do not drink alcohol.  If you are breastfeeding, limit how much and how often you drink alcohol.  Limit alcohol intake to no more than 1 drink per day for nonpregnant women. One drink equals 12 ounces of beer, 5 ounces of wine, or 1 ounces of hard liquor.  Do not use street drugs.  Do not share needles.  Ask your health care provider for help if you need support or information about quitting drugs.  Tell your health care provider if you often feel depressed.  Tell your health care provider if you have ever been abused  or do not feel safe at home. This information is not intended to replace advice given to you by your health care provider. Make sure you discuss any questions you have with your health care provider. Document Released: 04/07/2011 Document Revised: 02/28/2016 Document Reviewed: 06/26/2015 Elsevier Interactive Patient Education  [employer].

## 2018-11-12 ENCOUNTER — Encounter: Payer: Self-pay | Admitting: Family Medicine

## 2018-11-12 ENCOUNTER — Other Ambulatory Visit: Payer: Self-pay

## 2018-11-12 ENCOUNTER — Ambulatory Visit (INDEPENDENT_AMBULATORY_CARE_PROVIDER_SITE_OTHER): Payer: Medicare Other | Admitting: Family Medicine

## 2018-11-12 VITALS — BP 130/86 | HR 100 | Temp 98.4°F | Ht 62.0 in | Wt 162.0 lb

## 2018-11-12 DIAGNOSIS — L639 Alopecia areata, unspecified: Secondary | ICD-10-CM

## 2018-11-12 DIAGNOSIS — E559 Vitamin D deficiency, unspecified: Secondary | ICD-10-CM | POA: Diagnosis not present

## 2018-11-12 DIAGNOSIS — E538 Deficiency of other specified B group vitamins: Secondary | ICD-10-CM

## 2018-11-12 MED ORDER — FLUOCINONIDE 0.05 % EX SOLN: 1.0000 "application " | Freq: Two times a day (BID) | CUTANEOUS | 0 refills | Status: DC

## 2018-11-12 MED FILL — FLUOCINONIDE 0.05 % SOLN: 0.05 | 30 days supply | Qty: 60 | Fill #0

## 2018-11-12 NOTE — Assessment & Plan Note (Addendum)
Patient presents with a 3 cm circular area of hair loss in the occipital region.  Home remedies used by patient without any significant changes.  Presentation consistent with alopecia areata.  Patient's initial concern for vitamin deficiency given strict dietary habits.  Usually associated with possible autoimmune disease although patient does not have a significant history.  Will treat with fluocinonide solution which will apply once or twice a day for the next few days.  Will reassess in the next few weeks.  Given vegan diet will check for vitamin D, vitamin B, magnesium, phosphorus and TSH level.  We will follow-up on the results.

## 2018-11-12 NOTE — Progress Notes (Signed)
   Subjective:    Patient ID: Melanie Riddle, female    DOB: April 20, 1951, 68 y.o.   MRN: [id number]   CC: Hair loss  HPI: Patient is a 68 year old female who presents today complaining of hair loss.  Patient reports that she has noted 4 weeks ago of a small area on the back of her head with no hair.  Patient denies any similar presentation in the past.  Patient reports that she has been using almond oil, castor oil willow bark oil and has noted hair growth in that area.  Patient was concerned that her diet was discussed with hair loss.  She was a vegetarian for many years but recently transitioned to being a vegan.  Patient reports worry about vitamin deficiency even diet.  She is otherwise asymptomatic with no other acute complaint.  Patient denies any palpitation, headache, rash, nausea, vomiting, fever or chills.  Smoking status reviewed   ROS: all other systems were reviewed and are negative other than in the HPI   Past Medical History:  Diagnosis Date  . Allergy   . Arthritis of hand 04/07/2012  . Atrial fibrillation ([place]) 09/28/2014  . Decreased visual acuity 08/10/2015  . Ischemic optic neuropathy of right eye 03/17/2012   Asked for optometry note.   . Paroxysmal atrial fibrillation ([place]) 06/28/14   single episodes occured during recovery post colonoscopy  . Seasonal allergies 03/17/2012  . Sickle cell trait ([place])   . Unspecified viral hepatitis C without hepatic coma 08/13/2015    Past Surgical History:  Procedure Laterality Date  . CESAREAN SECTION    . SALPINGECTOMY Right 2005   Dr Ronnald Ramp  . TUMOR EXCISION Right    benign right thigh, [facility]    Past medical history, surgical, family, and social history reviewed and updated in the EMR as appropriate.  Objective:  BP 130/86   Pulse 100   Temp 98.4 F (36.9 C) (Oral)   Ht 5\' 2"  (1.575 m)   Wt 162 lb (73.5 kg)   SpO2 98%   BMI 29.63 kg/m     Vitals and nursing note reviewed  General: NAD, pleasant, able to  participate in exam HEENT: There is a 3 cm diameter circular area of clearance in occipital region, no excoriation, drainage or fluctuance appreciated.  Some hair growth noted on the peripheral region of the lesion.  Hair follicle noted. Cardiac: RRR, normal heart sounds, no murmurs. 2+ radial and PT pulses bilaterally Respiratory: CTAB, normal effort, No wheezes, rales or rhonchi Abdomen: soft, nontender, nondistended, no hepatic or splenomegaly, +BS Extremities: no edema or cyanosis. WWP. Skin: warm and dry, no rashes noted Neuro: alert and oriented x4, no focal deficits Psych: Normal affect and mood   Assessment & Plan:   Alopecia areata Patient presents with a 3 cm circular area of hair loss in the occipital region.  Home remedies used by patient without any significant changes.  Presentation consistent with alopecia areata.  Patient is initial concern for vitamin deficiency given strict dietary habits.  Usually associated with possible autoimmune disease although patient does not have a significant history.  Will treat with fluocinonide solution which will apply once or twice a day for the next few days.  Will reassess in the next few weeks.  Given the thickened diet will check for vitamin D, vitamin B, magnesium, phosphorus and TSH level.  We will follow-up on the results.    Marjie Skiff, MD [facility] PGY-3

## 2018-11-12 NOTE — Patient Instructions (Signed)
Alopecia Areata, Adult  Alopecia areata is a condition that causes you to lose hair. You may lose hair on your scalp in patches. In some cases, you may lose all the hair on your scalp (alopecia totalis) or all the hair from your face and body (alopecia universalis). Alopecia areata is an autoimmune disease. This means that your body's defense system (immune system) mistakes normal parts of the body for germs or other things that can make you sick. When you have alopecia areata, the immune system attacks the hair follicles. Alopecia areata usually develops in childhood, but it can develop at any age. For some people, their hair grows back on its own and hair loss does not happen again. For others, their hair may fall out and grow back in cycles. The hair loss may last many years. Having this condition can be emotionally difficult, but it is not dangerous. What are the causes? The cause of this condition is not known. What increases the risk? This condition is more likely to develop in people who have:  A family history of alopecia.  A family history of another autoimmune disease, including type 1 diabetes and rheumatoid arthritis.  Asthma and allergies.  Down syndrome. What are the signs or symptoms? Round spots of patchy hair loss on the scalp is the main symptom of this condition. The spots may be mildly itchy. Other symptoms include:  Short dark hairs in the bald patches that are wider at the top (exclamation point hairs).  Dents, white spots, or lines in the fingernails or toenails.  Balding and body hair loss. This is rare. How is this diagnosed? This condition is diagnosed based on your symptoms and family history. Your health care provider will also check your scalp skin, teeth, and nails. Your health care provider may refer you to a specialist in hair and skin disorders (dermatologist). You may also have tests, including:  A hair pull test.  Blood tests or other screening tests  to check for autoimmune diseases, such as thyroid disease or diabetes.  Skin biopsy to confirm the diagnosis.  A procedure to examine the skin with a lighted magnifying instrument (dermoscopy). How is this treated? There is no cure for alopecia areata. Treatment is aimed at promoting the regrowth of hair and preventing the immune system from overreacting. No single treatment is right for all people with alopecia areata. It depends on the type of hair loss you have and how severe it is. Work with your health care provider to find the best treatment for you. Treatment may include:  Having regular checkups to make sure the condition is not getting worse (watchful waiting).  Steroid creams or pills for 6-8 weeks to stop the immune reaction and help hair to regrow more quickly.  Other topical medicines to alter the immune system response and support the hair growth cycle.  Steroid injections.  Therapy and counseling with a support group or therapist if you are having trouble coping with hair loss. Follow these instructions at home:  Learn as much as you can about your condition.  Apply topical creams only as told by your health care provider.  Take over-the-counter and prescription medicines only as told by your health care provider.  Consider getting a wig or products to make hair look fuller or to cover bald spots, if you feel uncomfortable with your appearance.  Get therapy or counseling if you are having a hard time coping with hair loss. Ask your health care provider to recommend  or to cover bald spots, if you feel uncomfortable with your appearance.   Get therapy or counseling if you are having a hard time coping with hair loss. Ask your health care provider to recommend a counselor or support group.   Keep all follow-up visits as told by your health care provider. This is important.  Contact a health care provider if:   Your hair loss gets worse, even with treatment.   You have new symptoms.   You are struggling emotionally.  Summary   Alopecia areata is an autoimmune condition that makes your body's defense system (immune system) attack the hair follicles. This causes you  to lose hair.   Treatments may include regular checkups to make sure that the condition is not getting worse (watchful waiting), medicines, and steroid injections.  This information is not intended to replace advice given to you by your health care provider. Make sure you discuss any questions you have with your health care provider.  Document Released: 04/26/2004 Document Revised: 10/10/2016 Document Reviewed: 10/10/2016  Elsevier Interactive Patient Education  2019 Elsevier Inc[employer]

## 2018-11-13 LAB — VITAMIN B12: Vitamin B-12: 853 pg/mL (ref 232–1245)

## 2018-11-13 LAB — MAGNESIUM: Magnesium: 2.2 mg/dL (ref 1.6–2.3)

## 2018-11-13 LAB — TSH: TSH: 0.707 u[IU]/mL (ref 0.450–4.500)

## 2018-11-13 LAB — PHOSPHORUS: Phosphorus: 3.9 mg/dL (ref 3.0–4.3)

## 2018-11-13 LAB — VITAMIN D 25 HYDROXY (VIT D DEFICIENCY, FRACTURES): VIT D 25 HYDROXY: 25.8 ng/mL — AB (ref 30.0–100.0)

## 2019-04-20 ENCOUNTER — Encounter: Payer: Self-pay | Admitting: Family Medicine

## 2019-04-20 ENCOUNTER — Other Ambulatory Visit: Payer: Self-pay

## 2019-04-20 ENCOUNTER — Ambulatory Visit (INDEPENDENT_AMBULATORY_CARE_PROVIDER_SITE_OTHER): Payer: Medicare Other | Admitting: Family Medicine

## 2019-04-20 VITALS — BP 118/82 | HR 98

## 2019-04-20 DIAGNOSIS — S30860A Insect bite (nonvenomous) of lower back and pelvis, initial encounter: Secondary | ICD-10-CM

## 2019-04-20 DIAGNOSIS — W57XXXA Bitten or stung by nonvenomous insect and other nonvenomous arthropods, initial encounter: Secondary | ICD-10-CM

## 2019-04-20 DIAGNOSIS — Z9289 Personal history of other medical treatment: Secondary | ICD-10-CM

## 2019-04-20 DIAGNOSIS — Z1231 Encounter for screening mammogram for malignant neoplasm of breast: Secondary | ICD-10-CM

## 2019-04-20 MED ORDER — TRIAMCINOLONE ACETONIDE 0.1 % EX CREA: 1.0000 "application " | TOPICAL_CREAM | Freq: Two times a day (BID) | CUTANEOUS | 0 refills | Status: DC

## 2019-04-20 MED FILL — TRIAMCINOLONE 0.1% CREAM: 0.1 | 7 days supply | Qty: 30 | Fill #0

## 2019-04-20 NOTE — Assessment & Plan Note (Signed)
-   Last mammography in 10/2016, per guidelines screen until age 70. - Referral for screening mammography

## 2019-04-20 NOTE — Progress Notes (Signed)
   Subjective:    Patient ID: Melanie Riddle, female    DOB: 1950/11/30, 68 y.o.   MRN: [id number]   CC: rash on back  HPI: 3 days ago patient noticed painful, pruritic rash on R mid back. She feared it was shingles due to the pain and linear distribution, though she received Shingrix in 2018. Pt only makes excursions outside to grocery store 2x per month and otherwise strictly follows self-isolation, quarantine guidelines. She only sits outside in the morning to drink coffee when the weather is more mild. Last week her HVAC broke and she did not have AC for several days. She stayed at home and did not travel or sleep in other locations. She lives alone with no pets. She denies any new lotions, soaps, detergents, or foods. She washed her bedding the morning after she noticed the rash. Pt denies fever, chills, SOB, n/v/d, and HA.  Smoking status reviewed   ROS: pertinent noted in the HPI   Past medical history, surgical, family, and social history reviewed and updated in the EMR as appropriate.  Objective:  BP 118/82   Pulse 98   SpO2 99%   Vitals and nursing note reviewed  General: NAD, pleasant, able to participate in exam Extremities: no edema or cyanosis. Skin: warm and dry. Rash of multiple erythematous wheals with central punctate present in a linear distribution on R mid back. Single erythematous wheals present on L posterior lateral neck, R posterior upper arm, R elbow, and R dorsal forearm. Neuro: alert, no obvious focal deficits Psych: Normal affect and mildly anxious    Assessment & Plan:   Melanie Riddle is a 68 yo healthy woman present for likely insect bite dermatitis.  Insect bite Likely insect bite dermatitis due to wheal and central punctate nature of lesions - Reassured pt that diagnosis is unlikely to be shingles due to bilateral presence of lesions - Start triamcinolone acetate 1% ointment BID x 1 week on affected areas - Start OTC Allegra 1 tab qd for itching and  swelling of insect bites   History of mammography, screening - Last mammography in 10/2016, per guidelines screen until age 60. - Referral for screening mammography   Gladys Damme, MD [place] PGY-1

## 2019-04-20 NOTE — Assessment & Plan Note (Signed)
Likely insect bite dermatitis due to wheal and central punctate nature of lesions - Reassured pt that diagnosis is unlikely to be shingles due to bilateral presence of lesions - Start triamcinolone acetate 1% ointment BID x 1 week on affected areas - Start OTC Allegra 1 tab qd for itching and swelling of insect bites

## 2019-04-20 NOTE — Patient Instructions (Signed)
It was a pleasure meeting you!  1. You likely have a reaction to an insect bite.  2. Use triamcinolone ointment on the affected areas 2x per day for 1 week or until symptoms resolve.  3. You may also use an over the counter antihistamine, Allegra is a good choice for your age group. Take once per day to relieve itching and swelling.  4. You are due for a screening mammogram when it is convenient for you to do so.   5. If any concerns or questions, please call or schedule an appointment.  [place] better!  Dr. Chauncey Reading   Insect Bite, Adult An insect bite can make your skin red, itchy, and swollen. An insect bite is different from an insect sting, which happens when an insect injects poison (venom) into the skin. Some insects can spread disease to people through a bite. However, most insect bites do not lead to disease and are not serious. What are the causes? Insects may bite for a variety of reasons, including:  Hunger.  To defend themselves. Insects that bite include:  Spiders.  Mosquitoes.  Ticks.  Fleas.  Ants.  Flies.  Kissing bugs.  Chiggers. What are the signs or symptoms? Symptoms of this condition include:  Itching or pain in the bite area.  Redness and swelling in the bite area.  An open wound (skin ulcer). In many cases, symptoms last for 2-4 days. In rare cases, a person may have a severe allergic reaction (anaphylactic reaction) to a bite. Symptoms of an anaphylactic reaction may include:  Feeling warm in the face (flushed). This may include redness.  Itchy, red, swollen areas of skin (hives).  Swelling of the eyes, lips, face, mouth, tongue, or throat.  Difficulty breathing, speaking, or swallowing.  Noisy breathing (wheezing).  Dizziness or light-headedness.  Fainting.  Pain or cramping in the abdomen.  Vomiting.  Diarrhea. How is this diagnosed? This condition is usually diagnosed based on symptoms and a physical exam. How is this  treated? Treatment is usually not needed. Symptoms often go away on their own. When treatment is recommended, it may involve:  Applying a cream or lotion to the bite area. This treatment helps with itching.  Taking an antibiotic medicine. This treatment is needed if the bite area gets infected.  Getting a tetanus shot, if you are not up to date on this vaccine.  Applying ice to the affected area.  Allergy medicines called antihistamines. This treatment may be needed if you develop itching or an allergic reaction to the insect bite.  Giving yourself an epinephrine injection if you have an anaphylactic reaction to a bite. To give the injection, you will use what is commonly called an auto-injector "pen" (pre-filled automatic epinephrine injection device). Your health care provider will teach you how to use an auto-injector pen. Follow these instructions at home: Bite area care   Do not scratch the bite area.  Keep the bite area clean and dry. Wash it every day with soap and water as told by your health care provider.  Check the bite area every day for signs of infection. Check for: ? Redness, swelling, or pain. ? Fluid or blood. ? Warmth. ? Pus or a bad smell. Managing pain, itching, and swelling   You may apply cortisone cream, calamine lotion, or a paste made of baking soda and water to the bite area as told by your health care provider.  If directed, put ice on the bite area. ? Put ice in  a plastic bag. ? Place a towel between your skin and the bag. ? Leave the ice on for 20 minutes, 2-3 times a day. General instructions  Apply or take over-the-counter and prescription medicines only as told by your health care provider.  If you were prescribed an antibiotic medicine, take or apply it as told by your health care provider. Do not stop using the antibiotic even if your condition improves.  Keep all follow-up visits as told by your health care provider. This is important. How  is this prevented? To help reduce your risk of insect bites:  When you are outdoors, wear clothing that covers your arms and legs. This is especially important in the early morning and evening.  Use insect repellent. The best insect repellents contain DEET, picaridin, oil of lemon eucalyptus (OLE), or IR3535.  Consider spraying your clothing with a pesticide called permethrin. Permethrin helps prevent insect bites. It works for several weeks and for up to 5-6 clothing washes. Do not apply permethrin directly to the skin.  If your home windows do not have screens, consider installing them.  If you will be sleeping in an area where there are mosquitoes, consider covering your sleeping area with a mosquito net. Contact a health care provider if:  You have redness, swelling, or pain in the bite area.  You have fluid or blood coming from the bite area.  The bite area feels warm to the touch.  You have pus or a bad smell coming from the bite area.  You have a fever. Get help right away if:  You have joint pain.  You have a rash.  You feel unusually tired or sleepy.  You have neck pain.  You have a headache.  You have unusual weakness.  You develop symptoms of an anaphylactic reaction. These may include: ? Flushed skin. ? Hives. ? Swelling of the eyes, lips, face, mouth, tongue, or throat. ? Difficulty breathing, speaking, or swallowing. ? Wheezing. ? Dizziness or light-headedness. ? Fainting. ? Pain or cramping in the abdomen. ? Vomiting. ? Diarrhea. These symptoms may represent a serious problem that is an emergency. Do not wait to see if the symptoms will go away. Do the following right away:  Use the auto-injector pen as you have been instructed.  Get medical help. Call your local emergency services (911 in the [place] Do not drive yourself to the hospital. Summary  An insect bite can make your skin red, itchy, and swollen.  Treatment is usually not needed.  Symptoms often go away on their own. When treatment is recommended, it may involve taking medicine, applying medicine to the area, or applying ice.  Apply or take over-the-counter and prescription medicines only as told by your health care provider.  Use insect repellent to help prevent insect bites.  Contact a health care provider if you have any signs of infection in the bite area. This information is not intended to replace advice given to you by your health care provider. Make sure you discuss any questions you have with your health care provider. Document Released: 10/30/2004 Document Revised: 04/02/2018 Document Reviewed: 04/02/2018 Elsevier Patient Education  2020 [employer].

## 2019-06-16 ENCOUNTER — Ambulatory Visit (INDEPENDENT_AMBULATORY_CARE_PROVIDER_SITE_OTHER): Payer: Medicare Other

## 2019-06-16 ENCOUNTER — Other Ambulatory Visit: Payer: Self-pay

## 2019-06-16 VITALS — BP 118/77 | HR 88 | Temp 98.7°F | Ht 62.0 in | Wt 171.0 lb

## 2019-06-16 DIAGNOSIS — Z23 Encounter for immunization: Secondary | ICD-10-CM

## 2019-06-16 DIAGNOSIS — Z0000 Encounter for general adult medical examination without abnormal findings: Secondary | ICD-10-CM

## 2019-06-16 NOTE — Progress Notes (Addendum)
Subjective:   Melanie Riddle is a 68 y.o. female who presents for Medicare Annual (Subsequent) preventive examination.  The patient consented to a virtual visit.  Review of Systems: Defer to PCP Cardiac Risk Factors include: advanced age (>71men, >46 women) Objective:     Vitals: BP 118/77   Pulse 88   Temp 98.7 F (37.1 C) (Oral)   Ht 5\' 2"  (1.575 m)   Wt 171 lb (77.6 kg)   SpO2 98%   BMI 31.28 kg/m   Body mass index is 31.28 kg/m.  Advanced Directives 06/16/2019 06/29/2018 06/30/2017 02/03/2017 09/22/2016 07/03/2016 07/03/2016  Does Patient Have a Medical Advance Directive? No No No No No No No  Would patient like information on creating a medical advance directive? Yes (MAU/Ambulatory/Procedural Areas - Information given) Yes (MAU/Ambulatory/Procedural Areas - Information given) No - Patient declined Yes (MAU/Ambulatory/Procedural Areas - Information given) No - Patient declined Yes - Educational materials given -    Tobacco Social History   Tobacco Use  Smoking Status Former Smoker  . Types: Cigarettes  . Quit date: 10/06/1969  . Years since quitting: 49.7  Smokeless Tobacco Never Used  Tobacco Comment   No plans to re-start     Counseling given: Not Answered Comment: No plans to re-start   Clinical Intake:  Pre-visit preparation completed: Yes  Pain Score: 0-No pain  How often do you need to have someone help you when you read instructions, pamphlets, or other written materials from your doctor or pharmacy?: 1 - Never What is the last grade level you completed in school?: Bachelors Degree  Interpreter Needed?: No  Past Medical History:  Diagnosis Date  . Allergy   . Arthritis of hand 04/07/2012  . Atrial fibrillation ([place]) 09/28/2014  . Decreased visual acuity 08/10/2015  . Ischemic optic neuropathy of right eye 03/17/2012   Asked for optometry note.   . Paroxysmal atrial fibrillation ([place]) 06/28/14   single episodes occured during recovery post colonoscopy  .  Seasonal allergies 03/17/2012  . Sickle cell trait ([place])   . Unspecified viral hepatitis C without hepatic coma 08/13/2015   Past Surgical History:  Procedure Laterality Date  . CESAREAN SECTION    . SALPINGECTOMY Right 2005   Dr Ronnald Ramp  . TUMOR EXCISION Right    benign right thigh, [place] [facility]   Family History  Problem Relation Age of Onset  . Kidney disease Father   . Alcohol abuse Father   . Hypertension Mother   . GER disease Mother   . Stroke Brother   . Hypertension Brother   . Cancer Maternal Aunt        lung cancer  . Hypertension Sister   . Hypertension Daughter   . Sickle cell anemia Brother   . Sickle cell anemia Brother   . Colon cancer Neg Hx    Social History   Socioeconomic History  . Marital status: Divorced    Spouse name: Not on file  . Number of children: 2  . Years of education: 16  . Highest education level: Bachelor's degree (e.g., BA, AB, BS)  Occupational History  . Occupation: Retired    Comment: Statistician  . 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 Financial resource strain: Not hard at all  . Food insecurity    Worry: Never true    Inability: Never true  . Transportation needs    Medical: No    Non-medical: No  Tobacco Use  . Smoking status: Former Smoker    Types: Cigarettes    Quit date: 10/06/1969    Years since quitting: 49.7  . Smokeless tobacco: Never Used  . Tobacco comment: No plans to re-start  Substance and Sexual Activity  . Alcohol use: Yes    Alcohol/week: 1.0 standard drinks    Types: 1 Glasses of wine per week    Frequency: Never  . Drug use: No  . Sexual activity: Yes    Partners: Male    Birth control/protection: Post-menopausal  Lifestyle  . Physical activity    Days per week: 0 days    Minutes per session: 0 min  . Stress: Not at all  Relationships  . Social connections    Talks on phone: More than three times a week    Gets together: Once a week     Attends religious service: 1 to 4 times per year    Active member of club or organization: Yes    Attends meetings of clubs or organizations: More than 4 times per year    Relationship status: Divorced  Other Topics Concern  . Not on file  Social History Narrative   Patient lives alone here in [place].    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   Patient is hopeful to get back into going to [facility] for exercise. Has been closed during Pandemic, is set to reopen.   Patient has a close relationship with her daughter who lives in [place] and a son in [place].   Patients boyfriend lives in [place].    Outpatient Encounter Medications as of 06/16/2019  Medication Sig  . hydroxypropyl methylcellulose / hypromellose (ISOPTO TEARS / GONIOVISC) 2.5 % ophthalmic solution Place 1 drop into both eyes 3 (three) times daily as needed for dry eyes.  . Multiple Vitamin (MULTIVITAMIN WITH MINERALS) TABS tablet Take 1 tablet by mouth daily.  . Probiotic Product (PROBIOTIC PO) Take 1 tablet by mouth daily.  Marland Kitchen UNABLE TO FIND CBD oil 500 mg daily  . [DISCONTINUED] fluocinonide (LIDEX) 0.05 % external solution Apply 1 application topically 2 (two) times daily.  . [DISCONTINUED] triamcinolone cream (KENALOG) 0.1 % Apply 1 application topically 2 (two) times daily.   No facility-administered encounter medications on file as of 06/16/2019.     Activities of Daily Living In your present state of health, do you have any difficulty performing the following activities: 06/16/2019 06/29/2018  Hearing? N N  Vision? N N  Difficulty concentrating or making decisions? N N  Walking or climbing stairs? N N  Dressing or bathing? N N  Doing errands, shopping? N N  Preparing Food and eating ? N N  Using the Toilet? N N  In the past six months, have you accidently leaked urine? N N  Do you have problems with loss of bowel control? N N  Managing your  Medications? N N  Managing your Finances? N N  Housekeeping or managing your Housekeeping? N N  Some recent data might be hidden    Patient Care Team: Matilde Haymaker, MD as PCP - General (Family Medicine)    Assessment:   This is a routine wellness examination for Melanie Riddle.  Exercise Activities and Dietary recommendations Current Exercise Habits: The patient does not participate in regular exercise at present, Exercise limited by: None identified   Goals    . Exercise 3x per week (90 min per time) (pt-stated)  The [facility] has been closed during Pandemic.  Patient is hopeful to return soon. They just reopened.    . Weight < 160 lb (72.6 kg)     Would like to lose 10-15 pounds.         Fall Risk Fall Risk  06/16/2019 04/20/2019 06/29/2018 06/30/2017 02/03/2017  Falls in the past year? 0 0 No No No  Number falls in past yr: - 0 - - -  Follow up - Falls evaluation completed - - -   Is the patient's home free of loose throw rugs in walkways, pet beds, electrical cords, etc?   yes      Grab bars in the bathroom? yes      Handrails on the stairs?   yes      Adequate lighting?   yes  Patient rating of health (0-10) scale: 8   Depression Screen PHQ 2/9 Scores 06/16/2019 04/20/2019 06/29/2018 06/30/2017  PHQ - 2 Score 0 0 0 0     Cognitive Function  6CIT Screen 06/16/2019 06/29/2018  What Year? 0 points 0 points  What month? 0 points 0 points  What time? 0 points 0 points  Count back from 20 0 points 0 points  Months in reverse 0 points 0 points  Repeat phrase 0 points 0 points  Total Score 0 0    Immunization History  Administered Date(s) Administered  . Hepatitis B, adult 03/05/2016, 06/17/2016  . Influenza Split 09/13/2012  . Influenza,inj,Quad PF,6+ Mos 07/13/2013, 07/12/2014, 08/09/2015, 06/30/2017, 06/29/2018  . Influenza-Unspecified 08/20/2016  . Pneumococcal Conjugate-13 08/03/2014  . Pneumococcal Polysaccharide-23 08/09/2015  . Tdap 12/31/2015  . Zoster  12/03/2015    Screening Tests Health Maintenance  Topic Date Due  . MAMMOGRAM  10/21/2018  . INFLUENZA VACCINE  05/07/2019  . COLONOSCOPY  09/27/2024  . TETANUS/TDAP  12/30/2025  . DEXA SCAN  Completed  . Hepatitis C Screening  Completed  . PNA vac Low Risk Adult  Completed    Cancer Screenings: Lung: Low Dose CT Chest recommended if Age 61-80 years, 30 pack-year currently smoking OR have quit w/in 15years. Patient does not qualify. Breast:  Up to date on Mammogram? Yes   Up to date of Bone Density/Dexa? Yes Colorectal: Due 2025  Additional Screenings: Hepatitis C Screening: Completed  Plan:  Return tomorrow for your access to care apt. Continue your motivation to lose weight!! I hope you can get back to the [facility] soon! Your mammogram apt is in October. Fill out the advanced directive packet.  I have personally reviewed and noted the following in the patient's chart:   . Medical and social history . Use of alcohol, tobacco or illicit drugs  . Current medications and supplements . Functional ability and status . Nutritional status . Physical activity . Advanced directives . List of other physicians . Hospitalizations, surgeries, and ER visits in previous 12 months . Vitals . Screenings to include cognitive, depression, and falls . Referrals and appointments  In addition, I have reviewed and discussed with patient certain preventive protocols, quality metrics, and best practice recommendations. A written personalized care plan for preventive services as well as general preventive health recommendations were provided to patient.  Dorna Bloom, [place]  06/16/2019    I have reviewed this visit and agree with the documentation.   Matilde Haymaker, MD

## 2019-06-16 NOTE — Patient Instructions (Addendum)
You spoke to Melanie Riddle, [place] for your annual wellness visit.  We discussed goals: Goals    . Exercise 3x per week (90 min per time) (pt-stated)     [facility] has been closed during Pandemic.  Patient is hopeful to return soon. They just reopened.    . Weight < 160 lb (72.6 kg)     Would like to lose 10-15 pounds.        We also discussed recommended health maintenance. You are up to date with everything! I gave you your Flu vaccine today! Consider talking to your PCP about the 2 dose Shingles vaccine.   Health Maintenance  Topic Date Due  . MAMMOGRAM  10/21/2018  . INFLUENZA VACCINE  05/07/2019  . COLONOSCOPY  09/27/2024  . TETANUS/TDAP  12/30/2025  . DEXA SCAN  Completed  . Hepatitis C Screening  Completed  . PNA vac Low Risk Adult  Completed    Return tomorrow for your access to care apt. Continue your motivation to lose weight!! I hope you can get back to the [facility] soon! Your mammogram apt is in October. Fill out the advanced directive packet.    Preventive Care 3 Years and Older, Female Preventive care refers to lifestyle choices and visits with your health care provider that can promote health and wellness. This includes:  A yearly physical exam. This is also called an annual well check.  Regular dental and eye exams.  Immunizations.  Screening for certain conditions.  Healthy lifestyle choices, such as diet and exercise. What can I expect for my preventive care visit? Physical exam Your health care provider will check:  Height and weight. These may be used to calculate body mass index (BMI), which is a measurement that tells if you are at a healthy weight.  Heart rate and blood pressure.  Your skin for abnormal spots. Counseling Your health care provider may ask you questions about:  Alcohol, tobacco, and drug use.  Emotional well-being.  Home and relationship well-being.  Sexual activity.  Eating habits.  History of  falls.  Memory and ability to understand (cognition).  Work and work Statistician.  Pregnancy and menstrual history. What immunizations do I need?  Influenza (flu) vaccine  This is recommended every year. Tetanus, diphtheria, and pertussis (Tdap) vaccine  You may need a Td booster every 10 years. Varicella (chickenpox) vaccine  You may need this vaccine if you have not already been vaccinated. Zoster (shingles) vaccine  You may need this after age 49. Pneumococcal conjugate (PCV13) vaccine  One dose is recommended after age 23. Pneumococcal polysaccharide (PPSV23) vaccine  One dose is recommended after age 84. Measles, mumps, and rubella (MMR) vaccine  You may need at least one dose of MMR if you were born in 1957 or later. You may also need a second dose. Meningococcal conjugate (MenACWY) vaccine  You may need this if you have certain conditions. Hepatitis A vaccine  You may need this if you have certain conditions or if you travel or work in places where you may be exposed to hepatitis A. Hepatitis B vaccine  You may need this if you have certain conditions or if you travel or work in places where you may be exposed to hepatitis B. Haemophilus influenzae type b (Hib) vaccine  You may need this if you have certain conditions. You may receive vaccines as individual doses or as more than one vaccine together in one shot (combination vaccines). Talk with your health care  provider about the risks and benefits of combination vaccines. What tests do I need? Blood tests  Lipid and cholesterol levels. These may be checked every 5 years, or more frequently depending on your overall health.  Hepatitis C test.  Hepatitis B test. Screening  Lung cancer screening. You may have this screening every year starting at age 42 if you have a 30-pack-year history of smoking and currently smoke or have quit within the past 15 years.  Colorectal cancer screening. All adults should  have this screening starting at age 75 and continuing until age 70. Your health care provider may recommend screening at age 77 if you are at increased risk. You will have tests every 1-10 years, depending on your results and the type of screening test.  Diabetes screening. This is done by checking your blood sugar (glucose) after you have not eaten for a while (fasting). You may have this done every 1-3 years.  Mammogram. This may be done every 1-2 years. Talk with your health care provider about how often you should have regular mammograms.  BRCA-related cancer screening. This may be done if you have a family history of breast, ovarian, tubal, or peritoneal cancers. Other tests  Sexually transmitted disease (STD) testing.  Bone density scan. This is done to screen for osteoporosis. You may have this done starting at age 20. Follow these instructions at home: Eating and drinking  Eat a diet that includes fresh fruits and vegetables, whole grains, lean protein, and low-fat dairy products. Limit your intake of foods with high amounts of sugar, saturated fats, and salt.  Take vitamin and mineral supplements as recommended by your health care provider.  Do not drink alcohol if your health care provider tells you not to drink.  If you drink alcohol: ? Limit how much you have to 0-1 drink a day. ? Be aware of how much alcohol is in your drink. In the [place] one drink equals one 12 oz bottle of beer (355 mL), one 5 oz glass of wine (148 mL), or one 1 oz glass of hard liquor (44 mL). Lifestyle  Take daily care of your teeth and gums.  Stay active. Exercise for at least 30 minutes on 5 or more days each week.  Do not use any products that contain nicotine or tobacco, such as cigarettes, e-cigarettes, and chewing tobacco. If you need help quitting, ask your health care provider.  If you are sexually active, practice safe sex. Use a condom or other form of protection in order to prevent STIs  (sexually transmitted infections).  Talk with your health care provider about taking a low-dose aspirin or statin. What's next?  Go to your health care provider once a year for a well check visit.  Ask your health care provider how often you should have your eyes and teeth checked.  Stay up to date on all vaccines. This information is not intended to replace advice given to you by your health care provider. Make sure you discuss any questions you have with your health care provider. Document Released: 10/19/2015 Document Revised: 09/16/2018 Document Reviewed: 09/16/2018 Elsevier Patient Education  [employer].  Here is an example of what a healthy plate looks like:    ? Make half your plate fruits and vegetables.     ? Focus on whole fruits.     ? Vary your veggies.  ? Make half your grains whole grains. -     ? Look for the word "whole" at  the beginning of the ingredients list    ? Some whole-grain ingredients include whole oats, whole-wheat flour,        whole-grain corn, whole-grain brown rice, and whole rye.  ? Move to low-fat and fat-free milk or yogurt.  ? Vary your protein routine. - Meat, fish, poultry (chicken, [place]), eggs, beans (kidney, pinto), dairy.  ? Drink and eat less sodium, saturated fat, and added sugars.  Our clinic's number is [phone]. Please call with questions or concerns about what we discussed today.

## 2019-06-17 ENCOUNTER — Ambulatory Visit (INDEPENDENT_AMBULATORY_CARE_PROVIDER_SITE_OTHER): Payer: Medicare Other | Admitting: Family Medicine

## 2019-06-17 VITALS — BP 135/80 | HR 113 | Wt 171.8 lb

## 2019-06-17 DIAGNOSIS — B379 Candidiasis, unspecified: Secondary | ICD-10-CM | POA: Diagnosis not present

## 2019-06-17 DIAGNOSIS — N898 Other specified noninflammatory disorders of vagina: Secondary | ICD-10-CM

## 2019-06-17 LAB — POCT URINALYSIS DIP (MANUAL ENTRY)
Bilirubin, UA: NEGATIVE
Blood, UA: NEGATIVE
Glucose, UA: NEGATIVE mg/dL
Ketones, POC UA: NEGATIVE mg/dL
Nitrite, UA: NEGATIVE
Protein Ur, POC: NEGATIVE mg/dL
Spec Grav, UA: 1.015 (ref 1.010–1.025)
Urobilinogen, UA: 0.2 E.U./dL
pH, UA: 8 (ref 5.0–8.0)

## 2019-06-17 LAB — POCT WET PREP (WET MOUNT)
Clue Cells Wet Prep Whiff POC: NEGATIVE
Trichomonas Wet Prep HPF POC: ABSENT

## 2019-06-17 MED ORDER — FLUCONAZOLE 150 MG PO TABS: 150.0000 mg | ORAL_TABLET | Freq: Once | ORAL | 1 refills | Status: AC

## 2019-06-17 MED FILL — FLUCONAZOLE 150 MG TABLET: 150 | 1 days supply | Qty: 1 | Fill #0

## 2019-06-17 NOTE — Patient Instructions (Signed)
It was great meeting you today!  We performed a wet prep which was positive for yeast.  We will treat this with a medication called Diflucan. is a one-time dose.  I will give you a second dose in case you are still having symptoms as sometimes people need 2 doses.  We will keep your urine and do a urine culture.  If this comes back abnormal I will give you a call.

## 2019-06-21 ENCOUNTER — Encounter: Payer: Self-pay | Admitting: Family Medicine

## 2019-06-21 DIAGNOSIS — B379 Candidiasis, unspecified: Secondary | ICD-10-CM | POA: Insufficient documentation

## 2019-06-21 NOTE — Assessment & Plan Note (Signed)
Wet prep consistent with yeast infection likely from recent abx. Gave diflucan for treatment, follow up prn

## 2019-06-21 NOTE — Progress Notes (Signed)
   HPI 68 year old female who presents for 2-3 day history of vaginal itching. The patient states that she feels like it is related to a yeast infection. She received antibiotics after some dental work approximately 2 weeks ago. She is in a monogamous relationship and states that she does not feel it can possibly be an std as her boyfriend lives out of state and she has not seen him recently.  Wet prep performed and moderate yeasts were seen. UA performed without abnormality.  CC: vaginal itching   ROS:   Review of Systems See HPI for ROS.   CC, SH/smoking status, and VS noted  Objective: BP 135/80   Pulse (!) 113   Wt 171 lb 12.8 oz (77.9 kg)   SpO2 97%   BMI 31.42 kg/m  Gen: 68 year old AA female, no acute distress, pleasant CV: RRR, no murmur Resp: CTAB, no wheezes, non-labored Neuro: Alert and oriented, Speech clear, No gross deficits GU: excoriations of vulva noted. White discharge appreciated.   Assessment and plan:  Yeast infection Wet prep consistent with yeast infection likely from recent abx. Gave diflucan for treatment, follow up prn   Orders Placed This Encounter  Procedures  . POCT urinalysis dipstick  . POCT Wet Prep [facility])    Meds ordered this encounter  Medications  . fluconazole (DIFLUCAN) 150 MG tablet    Sig: Take 1 tablet (150 mg total) by mouth once for 1 dose.    Dispense:  1 tablet    Refill:  1     Guadalupe Dawn MD PGY-3 Family Medicine Resident  06/21/2019 8:18 AM

## 2019-07-19 ENCOUNTER — Ambulatory Visit
Admission: RE | Admit: 2019-07-19 | Discharge: 2019-07-19 | Disposition: A | Discharge status: Home / Self Care | Payer: Medicare Other | Source: Ambulatory Visit | Attending: Family Medicine | Admitting: Family Medicine

## 2019-07-19 ENCOUNTER — Other Ambulatory Visit: Payer: Self-pay

## 2019-07-19 DIAGNOSIS — Z1231 Encounter for screening mammogram for malignant neoplasm of breast: Secondary | ICD-10-CM | POA: Diagnosis not present

## 2019-07-21 ENCOUNTER — Other Ambulatory Visit: Payer: Self-pay | Admitting: Family Medicine

## 2019-07-21 DIAGNOSIS — R928 Other abnormal and inconclusive findings on diagnostic imaging of breast: Secondary | ICD-10-CM

## 2019-07-25 ENCOUNTER — Ambulatory Visit
Admission: RE | Admit: 2019-07-25 | Discharge: 2019-07-25 | Disposition: A | Discharge status: Home / Self Care | Payer: Medicare Other | Source: Ambulatory Visit | Attending: Family Medicine | Admitting: Family Medicine

## 2019-07-25 ENCOUNTER — Other Ambulatory Visit: Payer: Self-pay | Admitting: Family Medicine

## 2019-07-25 ENCOUNTER — Other Ambulatory Visit: Payer: Self-pay

## 2019-07-25 DIAGNOSIS — R928 Other abnormal and inconclusive findings on diagnostic imaging of breast: Secondary | ICD-10-CM | POA: Diagnosis not present

## 2019-07-25 DIAGNOSIS — N6310 Unspecified lump in the right breast, unspecified quadrant: Secondary | ICD-10-CM

## 2019-07-25 DIAGNOSIS — N6312 Unspecified lump in the right breast, upper inner quadrant: Secondary | ICD-10-CM | POA: Diagnosis not present

## 2019-09-12 DIAGNOSIS — Z03818 Encounter for observation for suspected exposure to other biological agents ruled out: Secondary | ICD-10-CM | POA: Diagnosis not present

## 2019-09-22 ENCOUNTER — Encounter: Payer: Self-pay | Admitting: Internal Medicine

## 2020-01-26 ENCOUNTER — Other Ambulatory Visit: Payer: Medicare Other

## 2020-06-26 DIAGNOSIS — Z23 Encounter for immunization: Secondary | ICD-10-CM | POA: Diagnosis not present

## 2020-06-28 DIAGNOSIS — I161 Hypertensive emergency: Secondary | ICD-10-CM | POA: Diagnosis not present

## 2020-06-28 DIAGNOSIS — R109 Unspecified abdominal pain: Secondary | ICD-10-CM | POA: Diagnosis not present

## 2020-06-28 DIAGNOSIS — R0789 Other chest pain: Secondary | ICD-10-CM | POA: Diagnosis not present

## 2020-06-28 DIAGNOSIS — Z8249 Family history of ischemic heart disease and other diseases of the circulatory system: Secondary | ICD-10-CM | POA: Diagnosis not present

## 2020-06-28 DIAGNOSIS — I214 Non-ST elevation (NSTEMI) myocardial infarction: Secondary | ICD-10-CM | POA: Diagnosis not present

## 2020-06-28 DIAGNOSIS — Z20822 Contact with and (suspected) exposure to covid-19: Secondary | ICD-10-CM | POA: Diagnosis not present

## 2020-06-28 DIAGNOSIS — I10 Essential (primary) hypertension: Secondary | ICD-10-CM | POA: Diagnosis not present

## 2020-06-28 DIAGNOSIS — E785 Hyperlipidemia, unspecified: Secondary | ICD-10-CM | POA: Diagnosis not present

## 2020-06-28 DIAGNOSIS — R079 Chest pain, unspecified: Secondary | ICD-10-CM | POA: Diagnosis not present

## 2020-06-28 DIAGNOSIS — I4891 Unspecified atrial fibrillation: Secondary | ICD-10-CM | POA: Diagnosis not present

## 2020-06-29 DIAGNOSIS — R079 Chest pain, unspecified: Secondary | ICD-10-CM | POA: Diagnosis not present

## 2020-06-29 DIAGNOSIS — I482 Chronic atrial fibrillation, unspecified: Secondary | ICD-10-CM | POA: Diagnosis not present

## 2020-06-29 DIAGNOSIS — I4891 Unspecified atrial fibrillation: Secondary | ICD-10-CM | POA: Diagnosis not present

## 2020-06-29 DIAGNOSIS — I10 Essential (primary) hypertension: Secondary | ICD-10-CM | POA: Diagnosis not present

## 2020-06-29 DIAGNOSIS — Z880 Allergy status to penicillin: Secondary | ICD-10-CM | POA: Diagnosis not present

## 2020-06-29 DIAGNOSIS — I2510 Atherosclerotic heart disease of native coronary artery without angina pectoris: Secondary | ICD-10-CM | POA: Diagnosis not present

## 2020-06-29 DIAGNOSIS — I161 Hypertensive emergency: Secondary | ICD-10-CM | POA: Diagnosis present

## 2020-06-29 DIAGNOSIS — I214 Non-ST elevation (NSTEMI) myocardial infarction: Secondary | ICD-10-CM | POA: Diagnosis not present

## 2020-06-29 DIAGNOSIS — R109 Unspecified abdominal pain: Secondary | ICD-10-CM | POA: Diagnosis not present

## 2020-06-29 DIAGNOSIS — Z20822 Contact with and (suspected) exposure to covid-19: Secondary | ICD-10-CM | POA: Diagnosis present

## 2020-06-29 DIAGNOSIS — I213 ST elevation (STEMI) myocardial infarction of unspecified site: Secondary | ICD-10-CM | POA: Diagnosis not present

## 2020-06-29 DIAGNOSIS — R0789 Other chest pain: Secondary | ICD-10-CM | POA: Diagnosis not present

## 2020-06-29 DIAGNOSIS — Z8249 Family history of ischemic heart disease and other diseases of the circulatory system: Secondary | ICD-10-CM | POA: Diagnosis not present

## 2020-06-29 DIAGNOSIS — E785 Hyperlipidemia, unspecified: Secondary | ICD-10-CM | POA: Diagnosis present

## 2020-06-29 DIAGNOSIS — Z8673 Personal history of transient ischemic attack (TIA), and cerebral infarction without residual deficits: Secondary | ICD-10-CM | POA: Diagnosis not present

## 2020-06-29 DIAGNOSIS — G459 Transient cerebral ischemic attack, unspecified: Secondary | ICD-10-CM | POA: Diagnosis not present

## 2020-06-30 DIAGNOSIS — I213 ST elevation (STEMI) myocardial infarction of unspecified site: Secondary | ICD-10-CM | POA: Diagnosis not present

## 2020-06-30 DIAGNOSIS — R079 Chest pain, unspecified: Secondary | ICD-10-CM | POA: Diagnosis not present

## 2020-06-30 DIAGNOSIS — I4891 Unspecified atrial fibrillation: Secondary | ICD-10-CM | POA: Diagnosis not present

## 2020-06-30 DIAGNOSIS — I2510 Atherosclerotic heart disease of native coronary artery without angina pectoris: Secondary | ICD-10-CM | POA: Diagnosis not present

## 2020-06-30 DIAGNOSIS — I10 Essential (primary) hypertension: Secondary | ICD-10-CM | POA: Diagnosis not present

## 2020-07-12 DIAGNOSIS — I4891 Unspecified atrial fibrillation: Secondary | ICD-10-CM | POA: Diagnosis not present

## 2020-07-12 DIAGNOSIS — I2510 Atherosclerotic heart disease of native coronary artery without angina pectoris: Secondary | ICD-10-CM | POA: Diagnosis not present

## 2020-07-31 DIAGNOSIS — Z955 Presence of coronary angioplasty implant and graft: Secondary | ICD-10-CM | POA: Diagnosis not present

## 2020-07-31 DIAGNOSIS — Z5189 Encounter for other specified aftercare: Secondary | ICD-10-CM | POA: Diagnosis not present

## 2020-08-02 DIAGNOSIS — Z955 Presence of coronary angioplasty implant and graft: Secondary | ICD-10-CM | POA: Diagnosis not present

## 2020-08-02 DIAGNOSIS — Z5189 Encounter for other specified aftercare: Secondary | ICD-10-CM | POA: Diagnosis not present

## 2020-08-07 DIAGNOSIS — Z23 Encounter for immunization: Secondary | ICD-10-CM | POA: Diagnosis not present

## 2020-08-07 DIAGNOSIS — Z955 Presence of coronary angioplasty implant and graft: Secondary | ICD-10-CM | POA: Diagnosis not present

## 2020-08-07 DIAGNOSIS — Z5189 Encounter for other specified aftercare: Secondary | ICD-10-CM | POA: Diagnosis not present

## 2020-08-09 DIAGNOSIS — Z955 Presence of coronary angioplasty implant and graft: Secondary | ICD-10-CM | POA: Diagnosis not present

## 2020-08-09 DIAGNOSIS — Z5189 Encounter for other specified aftercare: Secondary | ICD-10-CM | POA: Diagnosis not present

## 2020-08-14 DIAGNOSIS — Z5189 Encounter for other specified aftercare: Secondary | ICD-10-CM | POA: Diagnosis not present

## 2020-08-14 DIAGNOSIS — Z955 Presence of coronary angioplasty implant and graft: Secondary | ICD-10-CM | POA: Diagnosis not present

## 2020-08-16 DIAGNOSIS — Z5189 Encounter for other specified aftercare: Secondary | ICD-10-CM | POA: Diagnosis not present

## 2020-08-16 DIAGNOSIS — Z955 Presence of coronary angioplasty implant and graft: Secondary | ICD-10-CM | POA: Diagnosis not present

## 2020-08-21 DIAGNOSIS — Z5189 Encounter for other specified aftercare: Secondary | ICD-10-CM | POA: Diagnosis not present

## 2020-08-21 DIAGNOSIS — Z955 Presence of coronary angioplasty implant and graft: Secondary | ICD-10-CM | POA: Diagnosis not present

## 2020-08-23 DIAGNOSIS — Z955 Presence of coronary angioplasty implant and graft: Secondary | ICD-10-CM | POA: Diagnosis not present

## 2020-08-23 DIAGNOSIS — Z5189 Encounter for other specified aftercare: Secondary | ICD-10-CM | POA: Diagnosis not present

## 2020-09-04 DIAGNOSIS — Z5189 Encounter for other specified aftercare: Secondary | ICD-10-CM | POA: Diagnosis not present

## 2020-09-04 DIAGNOSIS — Z955 Presence of coronary angioplasty implant and graft: Secondary | ICD-10-CM | POA: Diagnosis not present

## 2020-09-06 DIAGNOSIS — Z955 Presence of coronary angioplasty implant and graft: Secondary | ICD-10-CM | POA: Diagnosis not present

## 2020-09-06 DIAGNOSIS — Z8673 Personal history of transient ischemic attack (TIA), and cerebral infarction without residual deficits: Secondary | ICD-10-CM | POA: Diagnosis not present

## 2020-09-06 DIAGNOSIS — I2510 Atherosclerotic heart disease of native coronary artery without angina pectoris: Secondary | ICD-10-CM | POA: Diagnosis not present

## 2020-09-06 DIAGNOSIS — Z5189 Encounter for other specified aftercare: Secondary | ICD-10-CM | POA: Diagnosis not present

## 2020-09-10 DIAGNOSIS — Z1331 Encounter for screening for depression: Secondary | ICD-10-CM | POA: Diagnosis not present

## 2020-09-10 DIAGNOSIS — Z955 Presence of coronary angioplasty implant and graft: Secondary | ICD-10-CM | POA: Diagnosis not present

## 2020-09-10 DIAGNOSIS — Z8673 Personal history of transient ischemic attack (TIA), and cerebral infarction without residual deficits: Secondary | ICD-10-CM | POA: Diagnosis not present

## 2020-09-10 DIAGNOSIS — I480 Paroxysmal atrial fibrillation: Secondary | ICD-10-CM | POA: Diagnosis not present

## 2020-09-10 DIAGNOSIS — I10 Essential (primary) hypertension: Secondary | ICD-10-CM | POA: Diagnosis not present

## 2020-09-10 DIAGNOSIS — I2510 Atherosclerotic heart disease of native coronary artery without angina pectoris: Secondary | ICD-10-CM | POA: Diagnosis not present

## 2020-09-10 DIAGNOSIS — D6869 Other thrombophilia: Secondary | ICD-10-CM | POA: Diagnosis not present

## 2020-09-10 DIAGNOSIS — Z87891 Personal history of nicotine dependence: Secondary | ICD-10-CM | POA: Diagnosis not present

## 2020-09-10 DIAGNOSIS — Z8619 Personal history of other infectious and parasitic diseases: Secondary | ICD-10-CM | POA: Diagnosis not present

## 2020-09-10 DIAGNOSIS — E559 Vitamin D deficiency, unspecified: Secondary | ICD-10-CM | POA: Diagnosis not present

## 2020-09-10 DIAGNOSIS — E538 Deficiency of other specified B group vitamins: Secondary | ICD-10-CM | POA: Diagnosis not present

## 2020-09-10 DIAGNOSIS — Z6829 Body mass index (BMI) 29.0-29.9, adult: Secondary | ICD-10-CM | POA: Diagnosis not present

## 2020-09-11 DIAGNOSIS — Z8673 Personal history of transient ischemic attack (TIA), and cerebral infarction without residual deficits: Secondary | ICD-10-CM | POA: Diagnosis not present

## 2020-09-11 DIAGNOSIS — I2510 Atherosclerotic heart disease of native coronary artery without angina pectoris: Secondary | ICD-10-CM | POA: Diagnosis not present

## 2020-09-11 DIAGNOSIS — Z5189 Encounter for other specified aftercare: Secondary | ICD-10-CM | POA: Diagnosis not present

## 2020-09-11 DIAGNOSIS — Z955 Presence of coronary angioplasty implant and graft: Secondary | ICD-10-CM | POA: Diagnosis not present

## 2020-09-13 DIAGNOSIS — Z955 Presence of coronary angioplasty implant and graft: Secondary | ICD-10-CM | POA: Diagnosis not present

## 2020-09-13 DIAGNOSIS — Z5189 Encounter for other specified aftercare: Secondary | ICD-10-CM | POA: Diagnosis not present

## 2020-09-13 DIAGNOSIS — I2510 Atherosclerotic heart disease of native coronary artery without angina pectoris: Secondary | ICD-10-CM | POA: Diagnosis not present

## 2020-09-13 DIAGNOSIS — Z8673 Personal history of transient ischemic attack (TIA), and cerebral infarction without residual deficits: Secondary | ICD-10-CM | POA: Diagnosis not present

## 2020-09-18 DIAGNOSIS — I2510 Atherosclerotic heart disease of native coronary artery without angina pectoris: Secondary | ICD-10-CM | POA: Diagnosis not present

## 2020-09-18 DIAGNOSIS — Z5189 Encounter for other specified aftercare: Secondary | ICD-10-CM | POA: Diagnosis not present

## 2020-09-18 DIAGNOSIS — Z8673 Personal history of transient ischemic attack (TIA), and cerebral infarction without residual deficits: Secondary | ICD-10-CM | POA: Diagnosis not present

## 2020-09-18 DIAGNOSIS — Z955 Presence of coronary angioplasty implant and graft: Secondary | ICD-10-CM | POA: Diagnosis not present

## 2020-09-20 DIAGNOSIS — I2510 Atherosclerotic heart disease of native coronary artery without angina pectoris: Secondary | ICD-10-CM | POA: Diagnosis not present

## 2020-09-20 DIAGNOSIS — Z955 Presence of coronary angioplasty implant and graft: Secondary | ICD-10-CM | POA: Diagnosis not present

## 2020-09-20 DIAGNOSIS — Z8673 Personal history of transient ischemic attack (TIA), and cerebral infarction without residual deficits: Secondary | ICD-10-CM | POA: Diagnosis not present

## 2020-09-20 DIAGNOSIS — Z5189 Encounter for other specified aftercare: Secondary | ICD-10-CM | POA: Diagnosis not present

## 2020-10-02 DIAGNOSIS — I2510 Atherosclerotic heart disease of native coronary artery without angina pectoris: Secondary | ICD-10-CM | POA: Diagnosis not present

## 2020-10-02 DIAGNOSIS — Z5189 Encounter for other specified aftercare: Secondary | ICD-10-CM | POA: Diagnosis not present

## 2020-10-02 DIAGNOSIS — Z955 Presence of coronary angioplasty implant and graft: Secondary | ICD-10-CM | POA: Diagnosis not present

## 2020-10-02 DIAGNOSIS — Z8673 Personal history of transient ischemic attack (TIA), and cerebral infarction without residual deficits: Secondary | ICD-10-CM | POA: Diagnosis not present

## 2021-10-06 IMAGING — US US BREAST*R* LIMITED INC AXILLA
1 series · 7 of 7 positions shown · non-contrast
Comparison: Previous exam(s).

CLINICAL DATA: Screening recall for a possible right breast mass.

EXAM:
DIGITAL DIAGNOSTIC RIGHT MAMMOGRAM WITH CAD AND TOMO
ULTRASOUND RIGHT BREAST

[Series 1: us breast*right* limited inc axilla · 0.06mm/px · 7 of 7 slices shown]
[im 1/7]
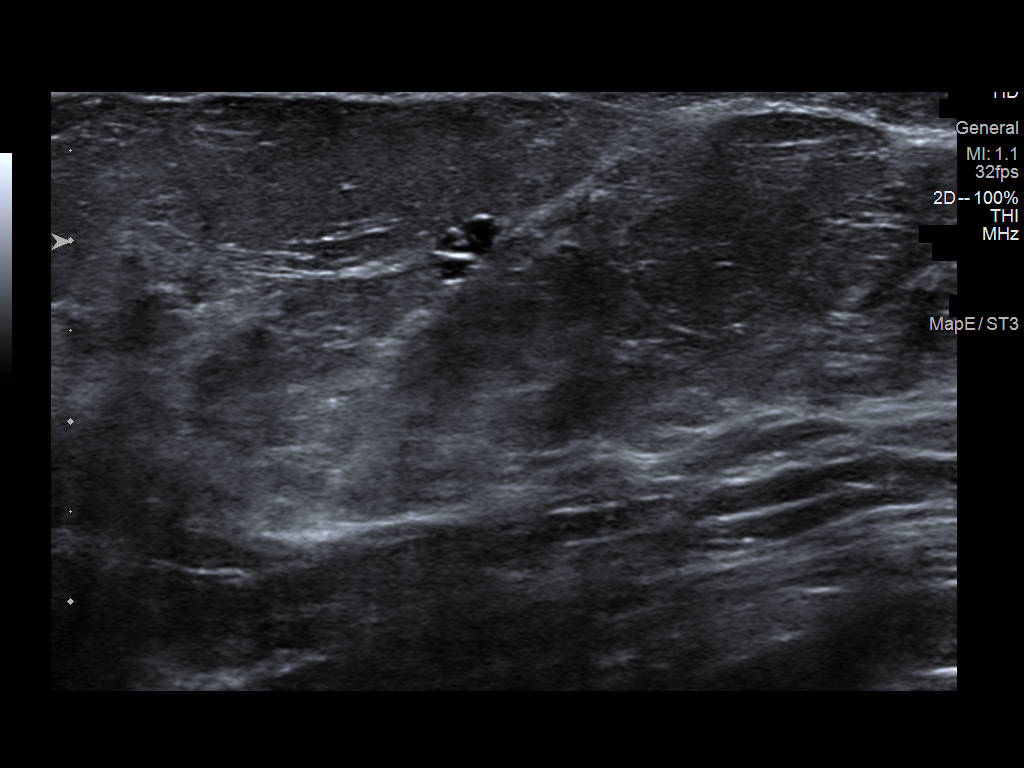
[im 2/7]
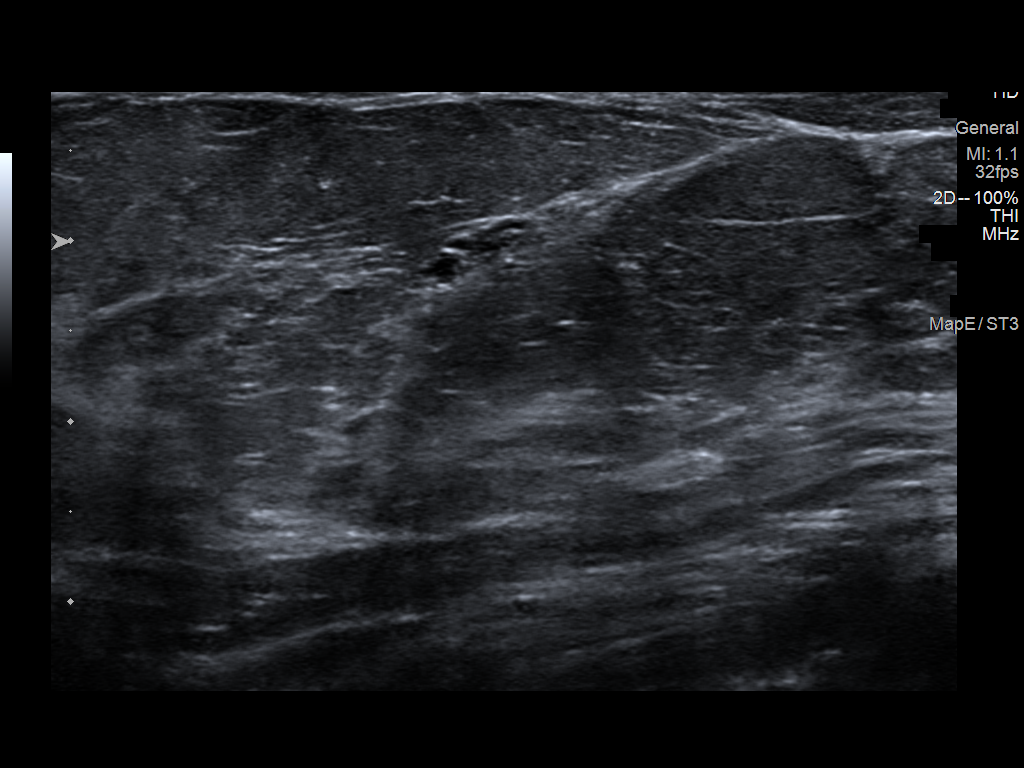
[im 3/7]
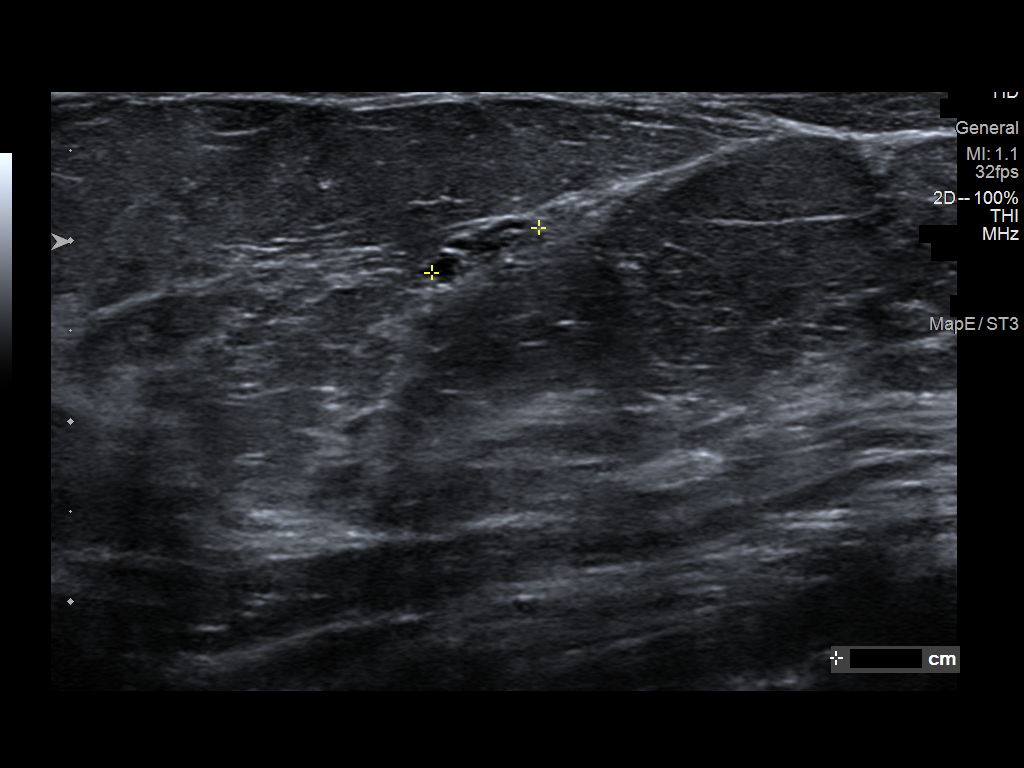
[im 4/7]
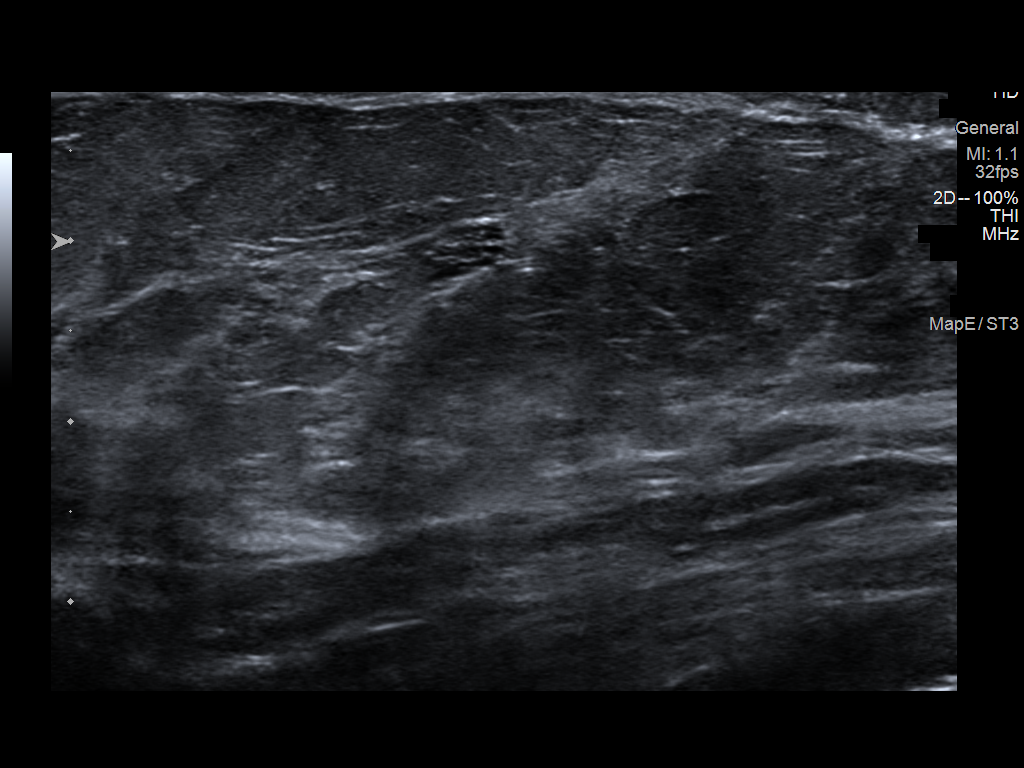
[im 5/7]
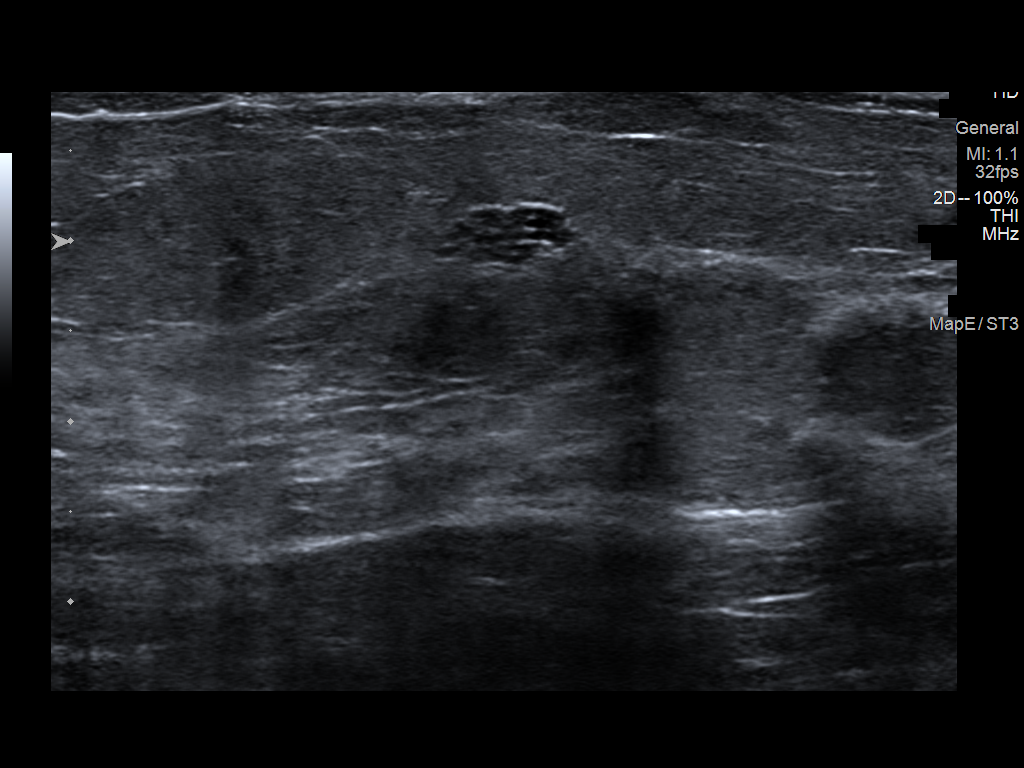
[im 6/7]
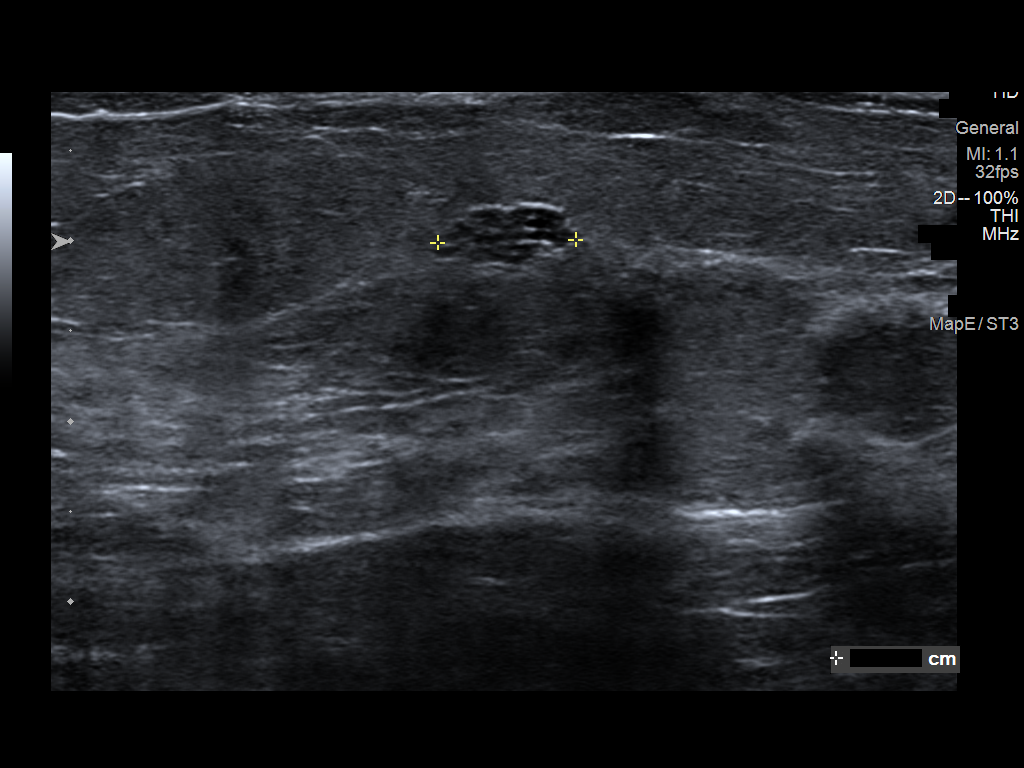
[im 7/7]
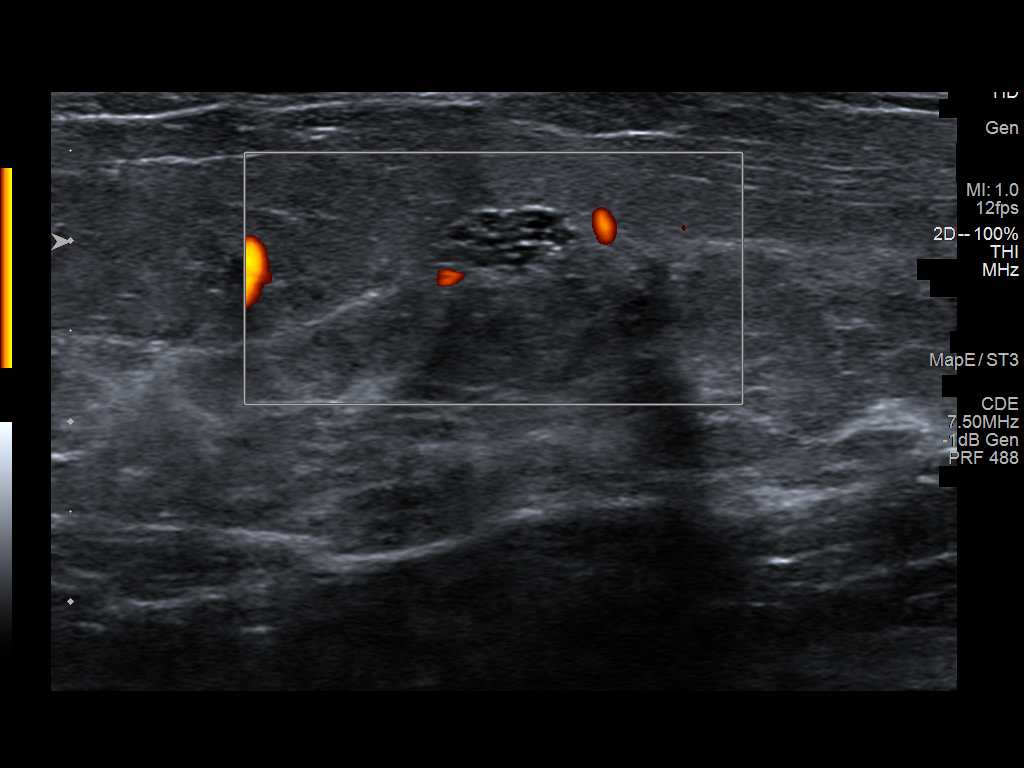

[7 of 7 positions shown; findings below may reference images not displayed]

ACR Breast Density Category b: There are scattered areas of
fibroglandular density.
FINDINGS: Spot compression tomosynthesis images reveal a persistent lobulated
irregular mass in the upper slightly inner quadrant of the right
breast measuring 9 mm.

Mammographic images were processed with CAD.

Ultrasound of the right breast at 1 o'clock, 2 cm from the nipple
demonstrates a hypoechoic oval circumscribed mass measuring 8 x 3 x
6 mm. There are anechoic spaces within the mass suggesting apocrine
metaplasia.
IMPRESSION: The mass in the right breast at 1 o'clock is likely benign, favored
to represent fibrocystic changes.

RECOMMENDATION:
Six-month follow-up right breast ultrasound.

I have discussed the findings and recommendations with the patient.
If applicable, a reminder letter will be sent to the patient
regarding the next appointment.

BI-RADS CATEGORY  3: Probably benign.

## 2021-10-06 IMAGING — MG MM DIGITAL DIAGNOSTIC UNILAT*R* W/ TOMO W/ CAD
4 series · 4 of 12 positions shown · non-contrast
Comparison: Previous exam(s).

CLINICAL DATA: Screening recall for a possible right breast mass.

EXAM:
DIGITAL DIAGNOSTIC RIGHT MAMMOGRAM WITH CAD AND TOMO
ULTRASOUND RIGHT BREAST

[R MLO synth-2D]
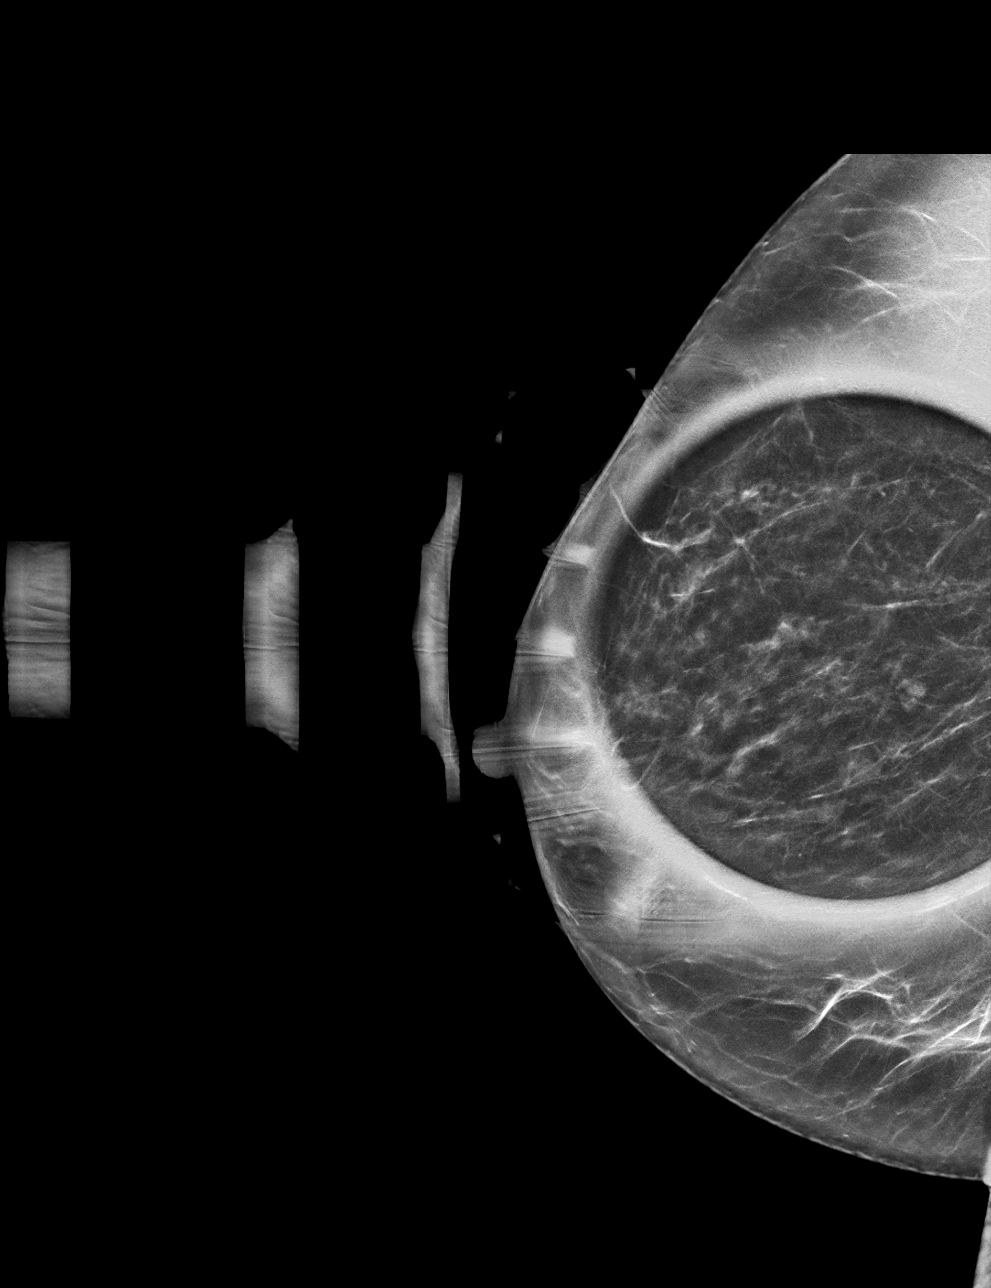

[R CC synth-2D]
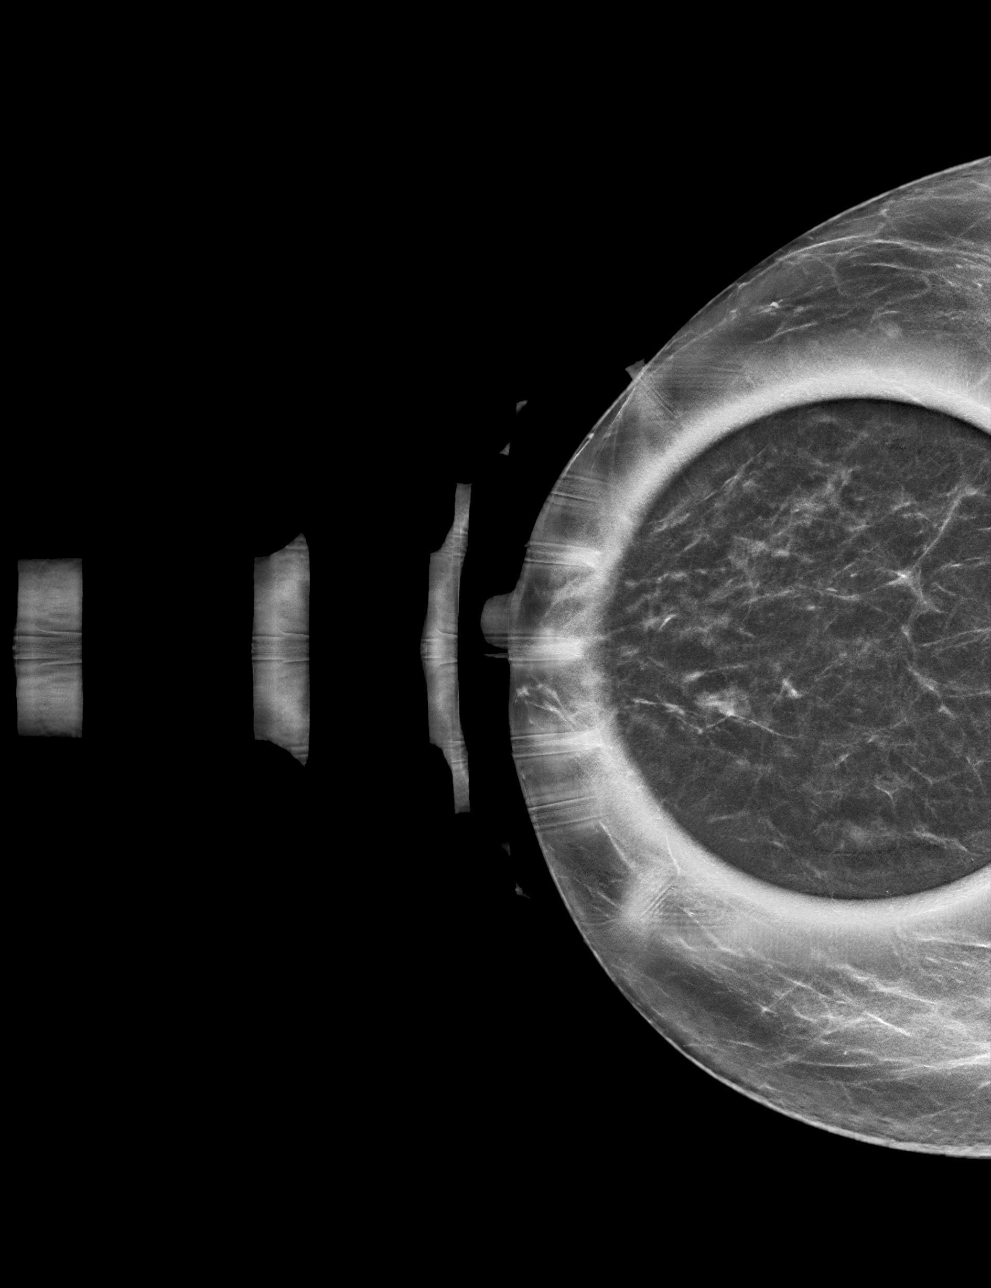

[R CC tomo · tomo slice 28/55.0]
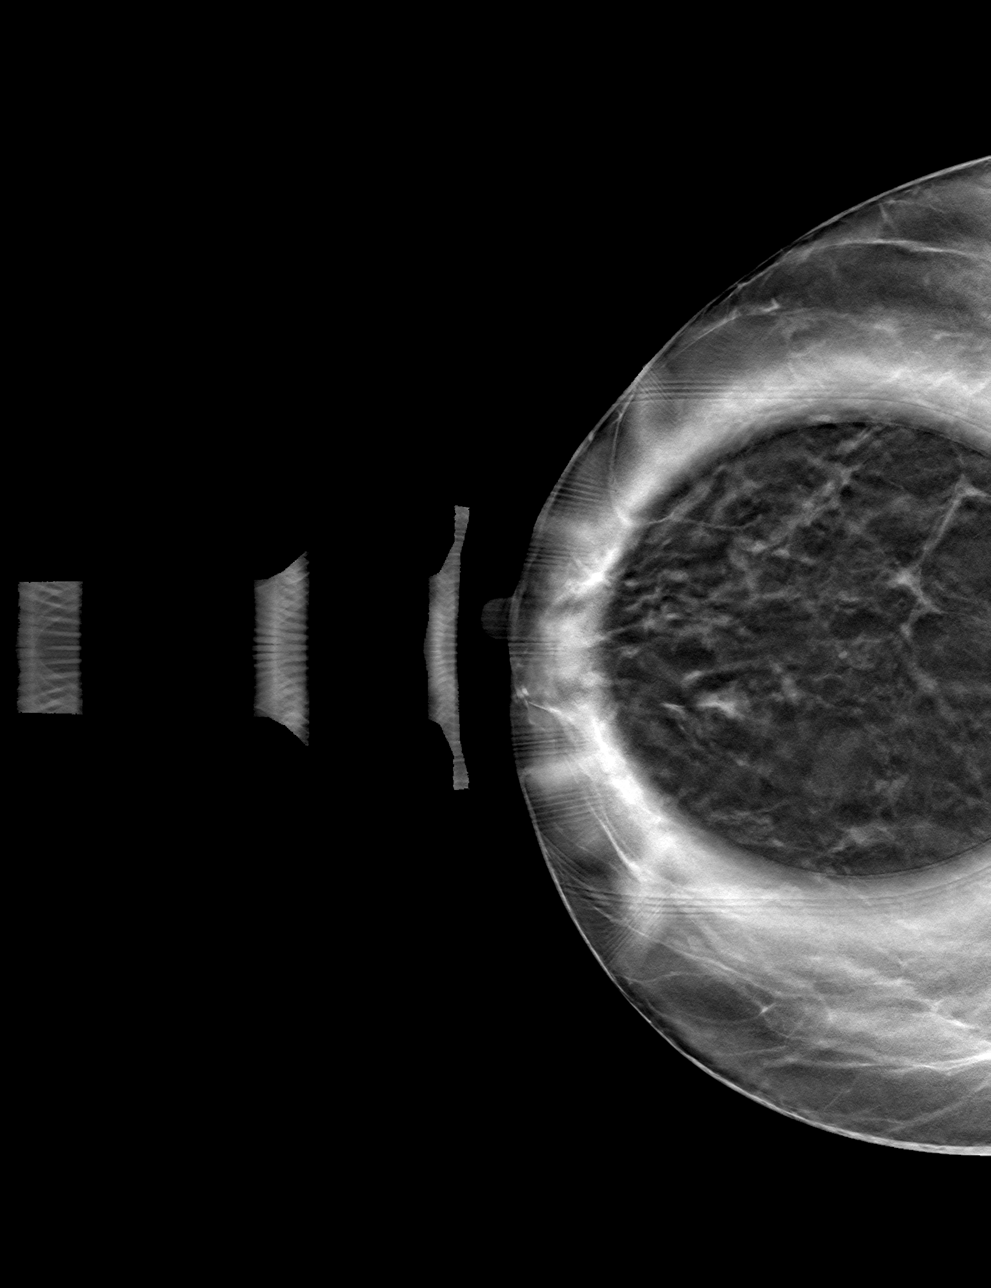

[R MLO tomo · tomo slice 30/59.0]
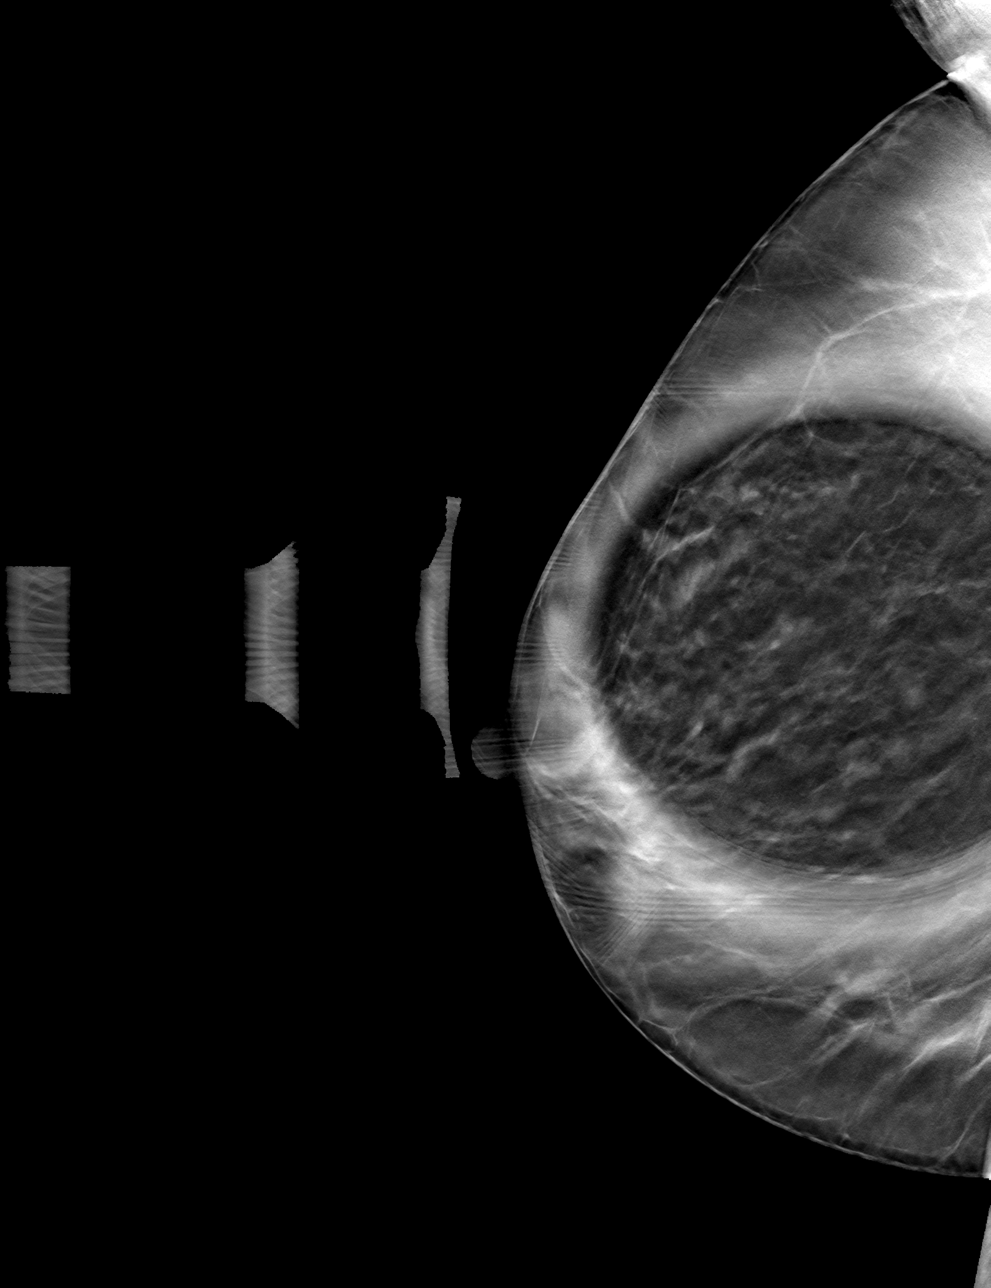

[4 of 12 positions shown; findings below may reference images not displayed]

ACR Breast Density Category b: There are scattered areas of
fibroglandular density.
FINDINGS: Spot compression tomosynthesis images reveal a persistent lobulated
irregular mass in the upper slightly inner quadrant of the right
breast measuring 9 mm.

Mammographic images were processed with CAD.

Ultrasound of the right breast at 1 o'clock, 2 cm from the nipple
demonstrates a hypoechoic oval circumscribed mass measuring 8 x 3 x
6 mm. There are anechoic spaces within the mass suggesting apocrine
metaplasia.
IMPRESSION: The mass in the right breast at 1 o'clock is likely benign, favored
to represent fibrocystic changes.

RECOMMENDATION:
Six-month follow-up right breast ultrasound.

I have discussed the findings and recommendations with the patient.
If applicable, a reminder letter will be sent to the patient
regarding the next appointment.

BI-RADS CATEGORY  3: Probably benign.

## 2022-03-11 ENCOUNTER — Encounter: Payer: Self-pay | Admitting: *Deleted

## 2023-10-05 DIAGNOSIS — I2510 Atherosclerotic heart disease of native coronary artery without angina pectoris: Secondary | ICD-10-CM | POA: Diagnosis not present

## 2023-10-05 DIAGNOSIS — M79604 Pain in right leg: Secondary | ICD-10-CM | POA: Diagnosis not present

## 2023-10-05 DIAGNOSIS — I10 Essential (primary) hypertension: Secondary | ICD-10-CM | POA: Diagnosis not present

## 2023-10-05 DIAGNOSIS — Z131 Encounter for screening for diabetes mellitus: Secondary | ICD-10-CM | POA: Diagnosis not present

## 2023-10-05 DIAGNOSIS — I480 Paroxysmal atrial fibrillation: Secondary | ICD-10-CM | POA: Diagnosis not present

## 2024-04-14 DIAGNOSIS — Z0000 Encounter for general adult medical examination without abnormal findings: Secondary | ICD-10-CM | POA: Diagnosis not present

## 2024-04-14 DIAGNOSIS — I480 Paroxysmal atrial fibrillation: Secondary | ICD-10-CM | POA: Diagnosis not present

## 2024-04-14 DIAGNOSIS — I10 Essential (primary) hypertension: Secondary | ICD-10-CM | POA: Diagnosis not present

## 2024-04-14 DIAGNOSIS — I2510 Atherosclerotic heart disease of native coronary artery without angina pectoris: Secondary | ICD-10-CM | POA: Diagnosis not present

## 2024-04-14 DIAGNOSIS — Z1211 Encounter for screening for malignant neoplasm of colon: Secondary | ICD-10-CM | POA: Diagnosis not present

## 2024-05-04 DIAGNOSIS — Z1212 Encounter for screening for malignant neoplasm of rectum: Secondary | ICD-10-CM | POA: Diagnosis not present

## 2024-05-04 DIAGNOSIS — Z1211 Encounter for screening for malignant neoplasm of colon: Secondary | ICD-10-CM | POA: Diagnosis not present

## 2024-06-07 DIAGNOSIS — Z1231 Encounter for screening mammogram for malignant neoplasm of breast: Secondary | ICD-10-CM | POA: Diagnosis not present

## 2024-06-22 DIAGNOSIS — Z23 Encounter for immunization: Secondary | ICD-10-CM | POA: Diagnosis not present

## 2024-07-19 DIAGNOSIS — I10 Essential (primary) hypertension: Secondary | ICD-10-CM | POA: Diagnosis not present

## 2024-07-19 DIAGNOSIS — R7309 Other abnormal glucose: Secondary | ICD-10-CM | POA: Diagnosis not present

## 2024-07-19 DIAGNOSIS — I2510 Atherosclerotic heart disease of native coronary artery without angina pectoris: Secondary | ICD-10-CM | POA: Diagnosis not present
